# Patient Record
Sex: Female | Born: 1935
Health system: Southern US, Community
[De-identification: ages and names within clinical notes are randomized; demographics above are authoritative.]

## PROBLEM LIST (undated history)

## (undated) DIAGNOSIS — E119 Type 2 diabetes mellitus without complications: Secondary | ICD-10-CM

## (undated) DIAGNOSIS — I10 Essential (primary) hypertension: Secondary | ICD-10-CM

## (undated) DIAGNOSIS — E785 Hyperlipidemia, unspecified: Secondary | ICD-10-CM

## (undated) DIAGNOSIS — I509 Heart failure, unspecified: Secondary | ICD-10-CM

## (undated) HISTORY — DX: Essential (primary) hypertension: I10

## (undated) HISTORY — DX: Heart failure, unspecified: I50.9

## (undated) HISTORY — DX: Hyperlipidemia, unspecified: E78.5

## (undated) HISTORY — DX: Type 2 diabetes mellitus without complications: E11.9

---

## 2001-03-18 ENCOUNTER — Encounter: Payer: Self-pay | Admitting: *Deleted

## 2001-03-18 ENCOUNTER — Encounter: Admission: RE | Admit: 2001-03-18 | Discharge: 2001-03-18 | Payer: Self-pay | Admitting: *Deleted

## 2001-05-14 ENCOUNTER — Encounter (INDEPENDENT_AMBULATORY_CARE_PROVIDER_SITE_OTHER): Payer: Self-pay | Admitting: Specialist

## 2001-05-14 ENCOUNTER — Ambulatory Visit (HOSPITAL_COMMUNITY): Admission: RE | Admit: 2001-05-14 | Discharge: 2001-05-14 | Payer: Self-pay | Admitting: Gastroenterology

## 2002-04-20 ENCOUNTER — Encounter: Admission: RE | Admit: 2002-04-20 | Discharge: 2002-04-20 | Payer: Self-pay | Admitting: Family Medicine

## 2002-04-20 ENCOUNTER — Encounter: Payer: Self-pay | Admitting: Family Medicine

## 2003-05-03 ENCOUNTER — Encounter: Payer: Self-pay | Admitting: Family Medicine

## 2003-05-03 ENCOUNTER — Encounter: Admission: RE | Admit: 2003-05-03 | Discharge: 2003-05-03 | Payer: Self-pay | Admitting: Family Medicine

## 2004-07-13 ENCOUNTER — Encounter: Admission: RE | Admit: 2004-07-13 | Discharge: 2004-07-13 | Payer: Self-pay | Admitting: Family Medicine

## 2005-08-17 ENCOUNTER — Encounter: Admission: RE | Admit: 2005-08-17 | Discharge: 2005-08-17 | Payer: Self-pay | Admitting: Family Medicine

## 2007-02-08 ENCOUNTER — Emergency Department (HOSPITAL_COMMUNITY): Admission: EM | Admit: 2007-02-08 | Discharge: 2007-02-09 | Payer: Self-pay | Admitting: Emergency Medicine

## 2010-03-24 ENCOUNTER — Ambulatory Visit: Payer: Self-pay | Admitting: Radiology

## 2010-03-24 ENCOUNTER — Ambulatory Visit (HOSPITAL_BASED_OUTPATIENT_CLINIC_OR_DEPARTMENT_OTHER): Admission: RE | Admit: 2010-03-24 | Discharge: 2010-03-24 | Payer: Self-pay | Admitting: Family Medicine

## 2011-02-16 NOTE — Procedures (Signed)
[facility]  Patient:    Amanda Bartlett, Amanda Bartlett                      MRN: [id number] Proc. Date: 05/14/01 Adm. Date:  [id number] Attending:  Rich Brave CC:         Marinda Elk, M.D./Carol Clarita Leber, P.A.-C., [facility]. Med.   Procedure Report  PROCEDURE:  Colonoscopy with biopsies.  INDICATION:  Heme-positive stool on one out of three specimens in a 76 year old asymptomatic patient.  FINDINGS:  Two diminutive polyps removed by cold biopsy technique.  DESCRIPTION OF PROCEDURE:  The nature, purpose, and risks of the procedure have been discussed with the patient who provided written consent.  Sedation for this procedure and the upper endoscopy which preceded it totaled fentanyl 100 mcg and Versed 10 mg IV without arrhythmias or desaturation.  The Olympus adult video colonoscope was advanced without too much difficulty to the base of the cecum, as identified by clear visualization of the appendiceal orifice, and pullback was then performed.  We had to turn the patient into the supine position to get into the base of the cecum.  There were two small sessile polyps, one at about 30 cm, one right at the level of the ileocecal valve on the contralateral wall of the colon, each removed by one or two cold biopsies.  No large polyps, cancer, colitis, vascular malformations, or diverticulosis were appreciated.  Retroflexion was attempted in the rectum but could not be accomplished due to a small ampulla. Antegrade viewing disclosed no distal rectal lesions.  The patient tolerated the procedure well, and there were no apparent complications.  IMPRESSION:  Normal exam except for two very small sessile polyps.  PLAN:  Await pathology on the polyps.  No source of heme-positive stool was identified on this exam, so it is felt more likely to be due to the gastritis observed during her endoscopy. DD:  05/14/01 TD:  05/14/01 Job: [id number]

## 2011-02-16 NOTE — Procedures (Signed)
[facility]  Patient:    Amanda Bartlett, Amanda Bartlett                      MRN: [id number] Proc. Date: 05/14/01 Adm. Date:  [id number] Attending:  Rich Brave CC:         Marinda Elk, M.D. / Katrina Stack, P.A.-C., [facility]. Med.   Procedure Report  PROCEDURE:  Upper endoscopy.  INDICATION:  Heme-positive stool on one out of three Hemoccults in a 76 year old female without symptoms.  FINDINGS:  Bile reflux with mucosal hemorrhagic gastritis.  DESCRIPTION OF PROCEDURE:  The nature, purpose and risks of the procedure have been reviewed with the patient who provided written consent.  Sedation was fentanyl 75 mcg and Versed 6 mg IV without desaturations or arrhythmias.  The Olympus video endoscope was passed under direct vision.  The vocal cords were briefly and looked normal.  The esophagus was entered with significant difficulty and had normal mucosa throughout its entire length,  Specifically, without evidence of reflux esophagitis, Barretts esophagus, varices, infection, or neoplasia.  No ring, stricture, or hiatal hernia was appreciated.  The stomach was entered.  It contained a moderate bilious residual, probably about 60 cc, with a little bit of retained debris that could either be food or medication.  In addition, there were multiple mucosal hemorrhages in the mid body of the stomach, although I did not really see diffuse beefy-red mucosa to suggest bile reflux gastritis and no erosions, ulcers, polyps, or masses were observed, including retroflex view of the proximal stomach.  The pylorus, duodenal bulb, and second duodenum looked normal.  The scope was removed from the patient.  No biopsies were obtained.  She tolerated the procedure well, and there were no apparent complications.  IMPRESSION:  Bile reflux with mucosal hemorrhages which might account for the patients heme-positive stool.  The bile reflux is not necessarily a pathologic  finding.  PLAN: 1. No specific treatment is needed for the observed endoscopic findings in    view of the absence of symptoms. 2. Proceed to colonoscopic evaluation in view of the heme-positive stool. DD:  05/14/01 TD:  05/14/01 Job: [id number]

## 2011-04-10 ENCOUNTER — Other Ambulatory Visit: Payer: Self-pay | Admitting: Family Medicine

## 2011-04-10 DIAGNOSIS — Z1231 Encounter for screening mammogram for malignant neoplasm of breast: Secondary | ICD-10-CM

## 2011-05-03 ENCOUNTER — Ambulatory Visit
Admission: RE | Admit: 2011-05-03 | Discharge: 2011-05-03 | Disposition: A | Discharge status: Home / Self Care | Payer: Medicare Other | Source: Ambulatory Visit | Attending: Family Medicine | Admitting: Family Medicine

## 2011-05-03 DIAGNOSIS — Z1231 Encounter for screening mammogram for malignant neoplasm of breast: Secondary | ICD-10-CM

## 2012-01-09 DIAGNOSIS — I10 Essential (primary) hypertension: Secondary | ICD-10-CM | POA: Diagnosis not present

## 2012-01-09 DIAGNOSIS — E7800 Pure hypercholesterolemia, unspecified: Secondary | ICD-10-CM | POA: Diagnosis not present

## 2012-01-09 DIAGNOSIS — IMO0001 Reserved for inherently not codable concepts without codable children: Secondary | ICD-10-CM | POA: Diagnosis not present

## 2012-04-25 DIAGNOSIS — IMO0001 Reserved for inherently not codable concepts without codable children: Secondary | ICD-10-CM | POA: Diagnosis not present

## 2012-04-25 DIAGNOSIS — E7800 Pure hypercholesterolemia, unspecified: Secondary | ICD-10-CM | POA: Diagnosis not present

## 2012-04-25 DIAGNOSIS — I10 Essential (primary) hypertension: Secondary | ICD-10-CM | POA: Diagnosis not present

## 2012-10-31 DIAGNOSIS — L989 Disorder of the skin and subcutaneous tissue, unspecified: Secondary | ICD-10-CM | POA: Diagnosis not present

## 2012-10-31 DIAGNOSIS — E119 Type 2 diabetes mellitus without complications: Secondary | ICD-10-CM | POA: Diagnosis not present

## 2012-10-31 DIAGNOSIS — E7800 Pure hypercholesterolemia, unspecified: Secondary | ICD-10-CM | POA: Diagnosis not present

## 2012-10-31 DIAGNOSIS — I10 Essential (primary) hypertension: Secondary | ICD-10-CM | POA: Diagnosis not present

## 2012-12-02 DIAGNOSIS — L570 Actinic keratosis: Secondary | ICD-10-CM | POA: Diagnosis not present

## 2012-12-02 DIAGNOSIS — D239 Other benign neoplasm of skin, unspecified: Secondary | ICD-10-CM | POA: Diagnosis not present

## 2012-12-02 DIAGNOSIS — L565 Disseminated superficial actinic porokeratosis (DSAP): Secondary | ICD-10-CM | POA: Diagnosis not present

## 2013-02-11 DIAGNOSIS — L565 Disseminated superficial actinic porokeratosis (DSAP): Secondary | ICD-10-CM | POA: Diagnosis not present

## 2013-02-11 DIAGNOSIS — L570 Actinic keratosis: Secondary | ICD-10-CM | POA: Diagnosis not present

## 2013-04-27 ENCOUNTER — Other Ambulatory Visit: Payer: Self-pay | Admitting: Family Medicine

## 2013-04-27 ENCOUNTER — Other Ambulatory Visit: Payer: Self-pay

## 2013-04-27 DIAGNOSIS — Z1231 Encounter for screening mammogram for malignant neoplasm of breast: Secondary | ICD-10-CM

## 2013-04-27 DIAGNOSIS — Z1239 Encounter for other screening for malignant neoplasm of breast: Secondary | ICD-10-CM | POA: Diagnosis not present

## 2013-04-27 DIAGNOSIS — N182 Chronic kidney disease, stage 2 (mild): Secondary | ICD-10-CM | POA: Diagnosis not present

## 2013-04-27 DIAGNOSIS — Z1211 Encounter for screening for malignant neoplasm of colon: Secondary | ICD-10-CM | POA: Diagnosis not present

## 2013-04-27 DIAGNOSIS — I10 Essential (primary) hypertension: Secondary | ICD-10-CM | POA: Diagnosis not present

## 2013-04-27 DIAGNOSIS — E119 Type 2 diabetes mellitus without complications: Secondary | ICD-10-CM | POA: Diagnosis not present

## 2013-04-27 DIAGNOSIS — E7800 Pure hypercholesterolemia, unspecified: Secondary | ICD-10-CM | POA: Diagnosis not present

## 2013-05-11 DIAGNOSIS — Z1211 Encounter for screening for malignant neoplasm of colon: Secondary | ICD-10-CM | POA: Diagnosis not present

## 2013-05-12 ENCOUNTER — Ambulatory Visit
Admission: RE | Admit: 2013-05-12 | Discharge: 2013-05-12 | Disposition: A | Discharge status: Home / Self Care | Payer: Medicare Other | Source: Ambulatory Visit | Attending: Family Medicine | Admitting: Family Medicine

## 2013-05-12 DIAGNOSIS — Z1231 Encounter for screening mammogram for malignant neoplasm of breast: Secondary | ICD-10-CM

## 2013-05-14 ENCOUNTER — Other Ambulatory Visit: Payer: Self-pay | Admitting: Family Medicine

## 2013-05-14 DIAGNOSIS — R928 Other abnormal and inconclusive findings on diagnostic imaging of breast: Secondary | ICD-10-CM

## 2013-05-29 ENCOUNTER — Ambulatory Visit
Admission: RE | Admit: 2013-05-29 | Discharge: 2013-05-29 | Disposition: A | Discharge status: Home / Self Care | Payer: Medicare Other | Source: Ambulatory Visit | Attending: Family Medicine | Admitting: Family Medicine

## 2013-05-29 DIAGNOSIS — N6489 Other specified disorders of breast: Secondary | ICD-10-CM | POA: Diagnosis not present

## 2013-05-29 DIAGNOSIS — R928 Other abnormal and inconclusive findings on diagnostic imaging of breast: Secondary | ICD-10-CM

## 2013-06-10 DIAGNOSIS — L570 Actinic keratosis: Secondary | ICD-10-CM | POA: Diagnosis not present

## 2013-06-10 DIAGNOSIS — L565 Disseminated superficial actinic porokeratosis (DSAP): Secondary | ICD-10-CM | POA: Diagnosis not present

## 2013-07-09 DIAGNOSIS — H2510 Age-related nuclear cataract, unspecified eye: Secondary | ICD-10-CM | POA: Diagnosis not present

## 2013-07-09 DIAGNOSIS — E119 Type 2 diabetes mellitus without complications: Secondary | ICD-10-CM | POA: Diagnosis not present

## 2013-07-30 DIAGNOSIS — Z23 Encounter for immunization: Secondary | ICD-10-CM | POA: Diagnosis not present

## 2013-09-20 DIAGNOSIS — J111 Influenza due to unidentified influenza virus with other respiratory manifestations: Secondary | ICD-10-CM | POA: Diagnosis not present

## 2013-10-29 DIAGNOSIS — E1129 Type 2 diabetes mellitus with other diabetic kidney complication: Secondary | ICD-10-CM | POA: Diagnosis not present

## 2013-10-29 DIAGNOSIS — E7800 Pure hypercholesterolemia, unspecified: Secondary | ICD-10-CM | POA: Diagnosis not present

## 2013-10-29 DIAGNOSIS — N182 Chronic kidney disease, stage 2 (mild): Secondary | ICD-10-CM | POA: Diagnosis not present

## 2013-10-29 DIAGNOSIS — I10 Essential (primary) hypertension: Secondary | ICD-10-CM | POA: Diagnosis not present

## 2013-11-05 ENCOUNTER — Other Ambulatory Visit: Payer: Self-pay | Admitting: Family Medicine

## 2013-11-05 DIAGNOSIS — N6310 Unspecified lump in the right breast, unspecified quadrant: Secondary | ICD-10-CM

## 2013-11-30 ENCOUNTER — Ambulatory Visit
Admission: RE | Admit: 2013-11-30 | Discharge: 2013-11-30 | Disposition: A | Discharge status: Home / Self Care | Payer: Medicare Other | Source: Ambulatory Visit | Attending: Family Medicine | Admitting: Family Medicine

## 2013-11-30 DIAGNOSIS — R928 Other abnormal and inconclusive findings on diagnostic imaging of breast: Secondary | ICD-10-CM | POA: Diagnosis not present

## 2013-11-30 DIAGNOSIS — N6310 Unspecified lump in the right breast, unspecified quadrant: Secondary | ICD-10-CM

## 2013-12-15 DIAGNOSIS — D1801 Hemangioma of skin and subcutaneous tissue: Secondary | ICD-10-CM | POA: Diagnosis not present

## 2013-12-15 DIAGNOSIS — L570 Actinic keratosis: Secondary | ICD-10-CM | POA: Diagnosis not present

## 2013-12-15 DIAGNOSIS — D239 Other benign neoplasm of skin, unspecified: Secondary | ICD-10-CM | POA: Diagnosis not present

## 2013-12-15 DIAGNOSIS — L821 Other seborrheic keratosis: Secondary | ICD-10-CM | POA: Diagnosis not present

## 2013-12-15 DIAGNOSIS — L819 Disorder of pigmentation, unspecified: Secondary | ICD-10-CM | POA: Diagnosis not present

## 2013-12-15 DIAGNOSIS — L565 Disseminated superficial actinic porokeratosis (DSAP): Secondary | ICD-10-CM | POA: Diagnosis not present

## 2013-12-15 DIAGNOSIS — D485 Neoplasm of uncertain behavior of skin: Secondary | ICD-10-CM | POA: Diagnosis not present

## 2013-12-16 DIAGNOSIS — C44611 Basal cell carcinoma of skin of unspecified upper limb, including shoulder: Secondary | ICD-10-CM | POA: Diagnosis not present

## 2013-12-16 DIAGNOSIS — D2330 Other benign neoplasm of skin of unspecified part of face: Secondary | ICD-10-CM | POA: Diagnosis not present

## 2014-01-08 DIAGNOSIS — C44611 Basal cell carcinoma of skin of unspecified upper limb, including shoulder: Secondary | ICD-10-CM | POA: Diagnosis not present

## 2014-01-11 DIAGNOSIS — C44611 Basal cell carcinoma of skin of unspecified upper limb, including shoulder: Secondary | ICD-10-CM | POA: Diagnosis not present

## 2014-04-27 DIAGNOSIS — E7800 Pure hypercholesterolemia, unspecified: Secondary | ICD-10-CM | POA: Diagnosis not present

## 2014-04-27 DIAGNOSIS — E1129 Type 2 diabetes mellitus with other diabetic kidney complication: Secondary | ICD-10-CM | POA: Diagnosis not present

## 2014-04-27 DIAGNOSIS — I10 Essential (primary) hypertension: Secondary | ICD-10-CM | POA: Diagnosis not present

## 2014-04-27 DIAGNOSIS — N182 Chronic kidney disease, stage 2 (mild): Secondary | ICD-10-CM | POA: Diagnosis not present

## 2014-05-18 ENCOUNTER — Other Ambulatory Visit: Payer: Self-pay

## 2014-05-18 ENCOUNTER — Other Ambulatory Visit: Payer: Self-pay | Admitting: Family Medicine

## 2014-05-18 DIAGNOSIS — N6489 Other specified disorders of breast: Secondary | ICD-10-CM

## 2014-05-25 DIAGNOSIS — E119 Type 2 diabetes mellitus without complications: Secondary | ICD-10-CM | POA: Diagnosis not present

## 2014-05-26 ENCOUNTER — Ambulatory Visit
Admission: RE | Admit: 2014-05-26 | Discharge: 2014-05-26 | Disposition: A | Discharge status: Home / Self Care | Payer: Medicare Other | Source: Ambulatory Visit | Attending: Family Medicine | Admitting: Family Medicine

## 2014-05-26 DIAGNOSIS — N6489 Other specified disorders of breast: Secondary | ICD-10-CM

## 2014-07-09 DIAGNOSIS — Z23 Encounter for immunization: Secondary | ICD-10-CM | POA: Diagnosis not present

## 2014-08-24 DIAGNOSIS — H2513 Age-related nuclear cataract, bilateral: Secondary | ICD-10-CM | POA: Diagnosis not present

## 2014-08-24 DIAGNOSIS — E119 Type 2 diabetes mellitus without complications: Secondary | ICD-10-CM | POA: Diagnosis not present

## 2014-10-13 DIAGNOSIS — Z23 Encounter for immunization: Secondary | ICD-10-CM | POA: Diagnosis not present

## 2014-11-01 DIAGNOSIS — E780 Pure hypercholesterolemia: Secondary | ICD-10-CM | POA: Diagnosis not present

## 2014-11-01 DIAGNOSIS — E119 Type 2 diabetes mellitus without complications: Secondary | ICD-10-CM | POA: Diagnosis not present

## 2014-11-01 DIAGNOSIS — Z1389 Encounter for screening for other disorder: Secondary | ICD-10-CM | POA: Diagnosis not present

## 2014-11-01 DIAGNOSIS — I10 Essential (primary) hypertension: Secondary | ICD-10-CM | POA: Diagnosis not present

## 2014-12-21 DIAGNOSIS — L814 Other melanin hyperpigmentation: Secondary | ICD-10-CM | POA: Diagnosis not present

## 2014-12-21 DIAGNOSIS — D1800 Hemangioma unspecified site: Secondary | ICD-10-CM | POA: Diagnosis not present

## 2014-12-21 DIAGNOSIS — L821 Other seborrheic keratosis: Secondary | ICD-10-CM | POA: Diagnosis not present

## 2014-12-21 DIAGNOSIS — Z85828 Personal history of other malignant neoplasm of skin: Secondary | ICD-10-CM | POA: Diagnosis not present

## 2014-12-21 DIAGNOSIS — L7211 Pilar cyst: Secondary | ICD-10-CM | POA: Diagnosis not present

## 2014-12-21 DIAGNOSIS — L565 Disseminated superficial actinic porokeratosis (DSAP): Secondary | ICD-10-CM | POA: Diagnosis not present

## 2014-12-21 DIAGNOSIS — L570 Actinic keratosis: Secondary | ICD-10-CM | POA: Diagnosis not present

## 2015-05-02 DIAGNOSIS — E780 Pure hypercholesterolemia: Secondary | ICD-10-CM | POA: Diagnosis not present

## 2015-05-02 DIAGNOSIS — E119 Type 2 diabetes mellitus without complications: Secondary | ICD-10-CM | POA: Diagnosis not present

## 2015-05-02 DIAGNOSIS — I10 Essential (primary) hypertension: Secondary | ICD-10-CM | POA: Diagnosis not present

## 2015-05-02 DIAGNOSIS — F4011 Social phobia, generalized: Secondary | ICD-10-CM | POA: Diagnosis not present

## 2015-05-05 DIAGNOSIS — Z1211 Encounter for screening for malignant neoplasm of colon: Secondary | ICD-10-CM | POA: Diagnosis not present

## 2015-05-12 ENCOUNTER — Other Ambulatory Visit: Payer: Self-pay

## 2015-05-12 DIAGNOSIS — Z1231 Encounter for screening mammogram for malignant neoplasm of breast: Secondary | ICD-10-CM

## 2015-06-01 ENCOUNTER — Ambulatory Visit
Admission: RE | Admit: 2015-06-01 | Discharge: 2015-06-01 | Disposition: A | Discharge status: Home / Self Care | Payer: Medicare Other | Source: Ambulatory Visit

## 2015-06-01 DIAGNOSIS — Z1231 Encounter for screening mammogram for malignant neoplasm of breast: Secondary | ICD-10-CM

## 2015-06-30 DIAGNOSIS — Z23 Encounter for immunization: Secondary | ICD-10-CM | POA: Diagnosis not present

## 2015-11-09 DIAGNOSIS — E119 Type 2 diabetes mellitus without complications: Secondary | ICD-10-CM | POA: Diagnosis not present

## 2015-11-09 DIAGNOSIS — Z7984 Long term (current) use of oral hypoglycemic drugs: Secondary | ICD-10-CM | POA: Diagnosis not present

## 2015-11-09 DIAGNOSIS — M545 Low back pain: Secondary | ICD-10-CM | POA: Diagnosis not present

## 2015-11-09 DIAGNOSIS — E1165 Type 2 diabetes mellitus with hyperglycemia: Secondary | ICD-10-CM | POA: Diagnosis not present

## 2015-11-09 DIAGNOSIS — I10 Essential (primary) hypertension: Secondary | ICD-10-CM | POA: Diagnosis not present

## 2015-11-14 DIAGNOSIS — M545 Low back pain: Secondary | ICD-10-CM | POA: Diagnosis not present

## 2015-11-17 DIAGNOSIS — M545 Low back pain: Secondary | ICD-10-CM | POA: Diagnosis not present

## 2015-11-21 DIAGNOSIS — M545 Low back pain: Secondary | ICD-10-CM | POA: Diagnosis not present

## 2015-11-24 DIAGNOSIS — M545 Low back pain: Secondary | ICD-10-CM | POA: Diagnosis not present

## 2015-11-28 DIAGNOSIS — M545 Low back pain: Secondary | ICD-10-CM | POA: Diagnosis not present

## 2015-12-09 DIAGNOSIS — M545 Low back pain: Secondary | ICD-10-CM | POA: Diagnosis not present

## 2015-12-20 DIAGNOSIS — M47816 Spondylosis without myelopathy or radiculopathy, lumbar region: Secondary | ICD-10-CM | POA: Diagnosis not present

## 2015-12-30 DIAGNOSIS — M47816 Spondylosis without myelopathy or radiculopathy, lumbar region: Secondary | ICD-10-CM | POA: Diagnosis not present

## 2015-12-30 DIAGNOSIS — M545 Low back pain: Secondary | ICD-10-CM | POA: Diagnosis not present

## 2016-01-21 DIAGNOSIS — M545 Low back pain: Secondary | ICD-10-CM | POA: Diagnosis not present

## 2016-01-21 DIAGNOSIS — G8929 Other chronic pain: Secondary | ICD-10-CM | POA: Diagnosis not present

## 2016-01-21 DIAGNOSIS — M47816 Spondylosis without myelopathy or radiculopathy, lumbar region: Secondary | ICD-10-CM | POA: Diagnosis not present

## 2016-02-08 DIAGNOSIS — M47816 Spondylosis without myelopathy or radiculopathy, lumbar region: Secondary | ICD-10-CM | POA: Diagnosis not present

## 2016-04-23 DIAGNOSIS — Z0100 Encounter for examination of eyes and vision without abnormal findings: Secondary | ICD-10-CM | POA: Diagnosis not present

## 2016-04-23 DIAGNOSIS — E119 Type 2 diabetes mellitus without complications: Secondary | ICD-10-CM | POA: Diagnosis not present

## 2016-05-08 DIAGNOSIS — H25812 Combined forms of age-related cataract, left eye: Secondary | ICD-10-CM | POA: Diagnosis not present

## 2016-05-08 DIAGNOSIS — H2512 Age-related nuclear cataract, left eye: Secondary | ICD-10-CM | POA: Diagnosis not present

## 2016-05-14 DIAGNOSIS — I10 Essential (primary) hypertension: Secondary | ICD-10-CM | POA: Diagnosis not present

## 2016-05-14 DIAGNOSIS — R0609 Other forms of dyspnea: Secondary | ICD-10-CM | POA: Diagnosis not present

## 2016-05-14 DIAGNOSIS — R011 Cardiac murmur, unspecified: Secondary | ICD-10-CM | POA: Diagnosis not present

## 2016-05-14 DIAGNOSIS — Z7984 Long term (current) use of oral hypoglycemic drugs: Secondary | ICD-10-CM | POA: Diagnosis not present

## 2016-05-14 DIAGNOSIS — E7800 Pure hypercholesterolemia, unspecified: Secondary | ICD-10-CM | POA: Diagnosis not present

## 2016-05-14 DIAGNOSIS — E119 Type 2 diabetes mellitus without complications: Secondary | ICD-10-CM | POA: Diagnosis not present

## 2016-05-15 ENCOUNTER — Telehealth: Payer: Self-pay | Admitting: Cardiology

## 2016-05-15 NOTE — Telephone Encounter (Signed)
Received records from [facility] for appointment on 07/02/16 with Dr Percival Spanish.  Records given to [facility] (Medical Records) for Dr Hochrein's schedule on 07/02/16. lp

## 2016-06-05 DIAGNOSIS — H25811 Combined forms of age-related cataract, right eye: Secondary | ICD-10-CM | POA: Diagnosis not present

## 2016-06-05 DIAGNOSIS — H2511 Age-related nuclear cataract, right eye: Secondary | ICD-10-CM | POA: Diagnosis not present

## 2016-06-18 DIAGNOSIS — I10 Essential (primary) hypertension: Secondary | ICD-10-CM | POA: Diagnosis not present

## 2016-06-18 DIAGNOSIS — Z23 Encounter for immunization: Secondary | ICD-10-CM | POA: Diagnosis not present

## 2016-06-20 ENCOUNTER — Encounter: Payer: Self-pay | Admitting: Cardiovascular Disease

## 2016-06-20 ENCOUNTER — Ambulatory Visit (INDEPENDENT_AMBULATORY_CARE_PROVIDER_SITE_OTHER): Payer: Medicare Other | Admitting: Cardiovascular Disease

## 2016-06-20 VITALS — BP 142/80 | HR 83 | Ht 65.0 in | Wt 162.0 lb

## 2016-06-20 DIAGNOSIS — R0600 Dyspnea, unspecified: Secondary | ICD-10-CM

## 2016-06-20 DIAGNOSIS — E119 Type 2 diabetes mellitus without complications: Secondary | ICD-10-CM | POA: Insufficient documentation

## 2016-06-20 DIAGNOSIS — E785 Hyperlipidemia, unspecified: Secondary | ICD-10-CM | POA: Insufficient documentation

## 2016-06-20 DIAGNOSIS — I10 Essential (primary) hypertension: Secondary | ICD-10-CM

## 2016-06-20 DIAGNOSIS — I351 Nonrheumatic aortic (valve) insufficiency: Secondary | ICD-10-CM | POA: Diagnosis not present

## 2016-06-20 DIAGNOSIS — R0609 Other forms of dyspnea: Secondary | ICD-10-CM

## 2016-06-20 NOTE — Assessment & Plan Note (Signed)
History of hypertension with blood pressure 142/80. He is hydrochlorothiazide, amlodipine and olmesartan. Continue current meds at current dosing

## 2016-06-20 NOTE — Assessment & Plan Note (Signed)
Mrs. Amanda Bartlett  was referred by Dr. Beryle Quant for evaluation of an auscultated murmur and new onset dyspnea on exertion. She has a history of treated hypertension, diabetes and hyperlipidemia. There is a family history of heart disease. She has never had a heart attack or stroke. Over the last year she's noted increased dyspnea on exertion with occasional atypical chest pain. Her primary care provider did auscultate a murmur. She does have a soft outflow check murmur although this does not sound like typical aortic stenosis. I'm going to get a 2-D echo to rule out critical AS and if this does not show significant aortic valve disease I will get a pharmacologic Myoview stress test.

## 2016-06-20 NOTE — Patient Instructions (Signed)
Medication Instructions:  NO CHANGES.   Testing/Procedures: Your physician has requested that you have an echocardiogram. Echocardiography is a painless test that uses sound waves to create images of your heart. It provides your doctor with information about the size and shape of your heart and how well your heart's chambers and valves are working. This procedure takes approximately one hour. There are no restrictions for this procedure.   Your physician has requested that you have a lexiscan myoview. For further information please visit [email or web address]. Please follow instruction sheet, as given.  ONLY IF NEEDED AFTER DR BERRY REVIEWS THE RESULTS OF YOUR ECHO. WE WILL SCHEDULE AT A LATER TIME IF NEEDED.   Follow-Up: Your physician recommends that you schedule a follow-up appointment in: 4-5 [place].   Any Other Special Instructions Will Be Listed Below (If Applicable). Echocardiogram An echocardiogram, or echocardiography, uses sound waves (ultrasound) to produce an image of your heart. The echocardiogram is simple, painless, obtained within a short period of time, and offers valuable information to your health care provider. The images from an echocardiogram can provide information such as:  Evidence of coronary artery disease (CAD).  Heart size.  Heart muscle function.  Heart valve function.  Aneurysm detection.  Evidence of a past heart attack.  Fluid buildup around the heart.  Heart muscle thickening.  Assess heart valve function. LET [facility] CARE PROVIDER KNOW ABOUT:  Any allergies you have.  All medicines you are taking, including vitamins, herbs, eye drops, creams, and over-the-counter medicines.  Previous problems you or members of your family have had with the use of anesthetics.  Any blood disorders you have.  Previous surgeries you have had.  Medical conditions you have.  Possibility of pregnancy, if this applies. BEFORE  THE PROCEDURE  No special preparation is needed. Eat and drink normally.  PROCEDURE   In order to produce an image of your heart, gel will be applied to your chest and a wand-like tool (transducer) will be moved over your chest. The gel will help transmit the sound waves from the transducer. The sound waves will harmlessly bounce off your heart to allow the heart images to be captured in real-time motion. These images will then be recorded.  You may need an IV to receive a medicine that improves the quality of the pictures. AFTER THE PROCEDURE You may return to your normal schedule including diet, activities, and medicines, unless your health care provider tells you otherwise.   This information is not intended to replace advice given to you by your health care provider. Make sure you discuss any questions you have with your health care provider.   Document Released: 09/14/2000 Document Revised: 10/08/2014 Document Reviewed: 05/25/2013 Elsevier Interactive Patient Education [employer].    If you need a refill on your cardiac medications before your next appointment, please call your pharmacy.

## 2016-06-20 NOTE — Progress Notes (Signed)
06/20/2016 Amanda Bartlett   [birth year]  [id number]  Primary Physician Corine Shelter, PA-C Primary Cardiologist: Lorretta Harp MD Renae Gloss  HPI:  Amanda Bartlett is a 81 year old mildly overweight married Caucasian female mother of 64, grandmother of 65 grandchildren who worked at [employer] and [employer] during her working years. She was referred by Dr. Beryle Quant for cardiovascular evaluation because of fairly new onset dyspnea on exertion and auscultated outflow tract murmur. Her risk factors include treated hypertension, diabetes and hyperlipidemia. There is a family history of heart disease. She has never had a heart attack or stroke. She's noticed increasing dyspnea on exertion over the last year with with occasional atypical chest pain. Her primary care provider did auscultate a murmur.   Current Outpatient Prescriptions  Medication Sig Dispense Refill  . aspirin EC 81 MG tablet Take 81 mg by mouth daily.    . Coenzyme Q10 (CO Q-10) 100 MG CAPS Take 100 mg by mouth daily.    Marland Kitchen glipiZIDE-metformin (METAGLIP) 5-500 MG tablet Take 1 tablet by mouth 2 (two) times daily.  1  . naproxen (NAPROSYN) 500 MG tablet Take 1 tablet by mouth daily as needed.  1  . Olmesartan-Amlodipine-HCTZ 40-5-12.5 MG TABS Take 1 tablet by mouth daily.  1  . prednisoLONE acetate (PRED FORTE) 1 % ophthalmic suspension Place 1 drop into the right eye 2 (two) times daily.  0  . simvastatin (ZOCOR) 20 MG tablet Take 20 mg by mouth daily.  3   No current facility-administered medications for this visit.     Allergies  Allergen Reactions  . Codeine Nausea Only  . Prednisone Swelling    Social History   Social History  . Marital status: Married    Spouse name: N/A  . Number of children: N/A  . Years of education: N/A   Occupational History  . Not on file.   Social History Main Topics  . Smoking status: Never Smoker  . Smokeless tobacco: Not on file  . Alcohol use Not on file  . Drug use: Unknown    . Sexual activity: Not on file   Other Topics Concern  . Not on file   Social History Narrative  . No narrative on file     Review of Systems: General: negative for chills, fever, night sweats or weight changes.  Cardiovascular: negative for chest pain, dyspnea on exertion, edema, orthopnea, palpitations, paroxysmal nocturnal dyspnea or shortness of breath Dermatological: negative for rash Respiratory: negative for cough or wheezing Urologic: negative for hematuria Abdominal: negative for nausea, vomiting, diarrhea, bright red blood per rectum, melena, or hematemesis Neurologic: negative for visual changes, syncope, or dizziness All other systems reviewed and are otherwise negative except as noted above.    Blood pressure (!) 142/80, pulse 83, height 5\' 5"  (1.651 m), weight 162 lb (73.5 kg), SpO2 96 %.  General appearance: alert and no distress Neck: no adenopathy, no carotid bruit, no JVD, supple, symmetrical, trachea midline and thyroid not enlarged, symmetric, no tenderness/mass/nodules Lungs: clear to auscultation bilaterally Heart: Soft outflow tract murmur Extremities: extremities normal, atraumatic, no cyanosis or edema and Palpable pedal pulses bilaterally  EKG normal sinus rhythm at 83 with left anterior fascicular block and reversible airway progression with occasional PVCs. I personally reviewed this EKG  ASSESSMENT AND PLAN:   Essential hypertension History of hypertension with blood pressure 142/80. He is hydrochlorothiazide, amlodipine and olmesartan. Continue current meds at current dosing  Hyperlipidemia History of hyperlipidemia on statin therapy  followed by her PCP  Dyspnea on exertion Amanda Bartlett  was referred by Dr. Beryle Quant for evaluation of an auscultated murmur and new onset dyspnea on exertion. She has a history of treated hypertension, diabetes and hyperlipidemia. There is a family history of heart disease. She has never had a heart attack or stroke.  Over the last year she's noted increased dyspnea on exertion with occasional atypical chest pain. Her primary care provider did auscultate a murmur. She does have a soft outflow check murmur although this does not sound like typical aortic stenosis. I'm going to get a 2-D echo to rule out critical AS and if this does not show significant aortic valve disease I will get a pharmacologic Myoview stress test.      Lorretta Harp MD [facility], [facility] 06/20/2016 10:23 AM

## 2016-06-20 NOTE — Assessment & Plan Note (Signed)
History of hyperlipidemia on statin therapy followed by her PCP. 

## 2016-07-02 ENCOUNTER — Ambulatory Visit: Payer: Medicare Other | Admitting: Cardiology

## 2016-07-06 ENCOUNTER — Encounter: Payer: Self-pay | Admitting: Cardiovascular Disease

## 2016-07-06 ENCOUNTER — Telehealth: Payer: Self-pay | Admitting: Cardiovascular Disease

## 2016-07-06 NOTE — Telephone Encounter (Signed)
Closed encounter °

## 2016-07-09 ENCOUNTER — Other Ambulatory Visit: Payer: Self-pay

## 2016-07-09 ENCOUNTER — Ambulatory Visit (HOSPITAL_COMMUNITY): Payer: Medicare Other | Attending: Cardiovascular Disease

## 2016-07-09 DIAGNOSIS — I517 Cardiomegaly: Secondary | ICD-10-CM | POA: Insufficient documentation

## 2016-07-09 DIAGNOSIS — I351 Nonrheumatic aortic (valve) insufficiency: Secondary | ICD-10-CM | POA: Insufficient documentation

## 2016-07-09 DIAGNOSIS — I509 Heart failure, unspecified: Secondary | ICD-10-CM | POA: Insufficient documentation

## 2016-07-24 ENCOUNTER — Ambulatory Visit (INDEPENDENT_AMBULATORY_CARE_PROVIDER_SITE_OTHER): Payer: Medicare Other | Admitting: Cardiovascular Disease

## 2016-07-24 ENCOUNTER — Encounter: Payer: Self-pay | Admitting: Cardiovascular Disease

## 2016-07-24 VITALS — BP 132/64 | HR 86 | Ht 65.0 in | Wt 164.0 lb

## 2016-07-24 DIAGNOSIS — R0609 Other forms of dyspnea: Secondary | ICD-10-CM

## 2016-07-24 DIAGNOSIS — Z79899 Other long term (current) drug therapy: Secondary | ICD-10-CM | POA: Diagnosis not present

## 2016-07-24 DIAGNOSIS — R0600 Dyspnea, unspecified: Secondary | ICD-10-CM

## 2016-07-24 MED ORDER — FUROSEMIDE 20 MG PO TABS: 20.0000 mg | ORAL_TABLET | Freq: Every day | ORAL | 3 refills | Status: DC

## 2016-07-24 NOTE — Patient Instructions (Signed)
Medication Instructions:  START Furosemide 20 mg daily.  Labwork: Your physician recommends that you return for lab work in: 7-10 days--BMET (Oct. 31-Nov 3)   Testing/Procedures: Your physician has requested that you have a lexiscan myoview. For further information please visit [email or web address]. Please follow instruction sheet, as given.    Follow-Up: We request that you follow-up in: 4-6 weeks with APP with an extender and in 3 months with Dr Andria Rhein will receive a reminder letter in the mail two months in advance. If you don't receive a letter, please call our office to schedule the follow-up appointment.    Any Other Special Instructions Will Be Listed Below (If Applicable).  Pharmacologic Stress Electrocardiogram A pharmacologic stress electrocardiogram is a heart (cardiac) test that uses nuclear imaging to evaluate the blood supply to your heart. This test may also be called a pharmacologic stress electrocardiography. Pharmacologic means that a medicine is used to increase your heart rate and blood pressure.  This stress test is done to find areas of poor blood flow to the heart by determining the extent of coronary artery disease (CAD). Some people exercise on a treadmill, which naturally increases the blood flow to the heart. For those people unable to exercise on a treadmill, a medicine is used. This medicine stimulates your heart and will cause your heart to beat harder and more quickly, as if you were exercising.  Pharmacologic stress tests can help determine:  The adequacy of blood flow to your heart during increased levels of activity in order to clear you for discharge home.  The extent of coronary artery blockage caused by CAD.  Your prognosis if you have suffered a heart attack.  The effectiveness of cardiac procedures done, such as an angioplasty, which can increase the circulation in your coronary arteries.  Causes of chest pain or pressure. LET [facility]  CARE PROVIDER KNOW ABOUT:  Any allergies you have.  All medicines you are taking, including vitamins, herbs, eye drops, creams, and over-the-counter medicines.  Previous problems you or members of your family have had with the use of anesthetics.  Any blood disorders you have.  Previous surgeries you have had.  Medical conditions you have.  Possibility of pregnancy, if this applies.  If you are currently breastfeeding. RISKS AND COMPLICATIONS Generally, this is a safe procedure. However, as with any procedure, complications can occur. Possible complications include:  You develop pain or pressure in the following areas:  Chest.  Jaw or neck.  Between your shoulder blades.  Radiating down your left arm.  Headache.  Dizziness or light-headedness.  Shortness of breath.  Increased or irregular heartbeat.  Low blood pressure.  Nausea or vomiting.  Flushing.  Redness going up the arm and slight pain during injection of medicine.  Heart attack (rare). BEFORE THE PROCEDURE   Avoid all forms of caffeine for 24 hours before your test or as directed by your health care provider. This includes coffee, tea (even decaffeinated tea), caffeinated sodas, chocolate, cocoa, and certain pain medicines.  Follow your health care provider's instructions regarding eating and drinking before the test.  Take your medicines as directed at regular times with water unless instructed otherwise. Exceptions may include:  If you have diabetes, ask how you are to take your insulin or pills. It is common to adjust insulin dosing the morning of the test.  If you are taking beta-blocker medicines, it is important to talk to your health care provider about these medicines well before the date  of your test. Taking beta-blocker medicines may interfere with the test. In some cases, these medicines need to be changed or stopped 24 hours or more before the test.  If you wear a nitroglycerin patch, it  may need to be removed prior to the test. Ask your health care provider if the patch should be removed before the test.  If you use an inhaler for any breathing condition, bring it with you to the test.  If you are an outpatient, bring a snack so you can eat right after the stress phase of the test.  Do not smoke for 4 hours prior to the test or as directed by your health care provider.  Do not apply lotions, powders, creams, or oils on your chest prior to the test.  Wear comfortable shoes and clothing. Let your health care provider know if you were unable to complete or follow the preparations for your test. PROCEDURE   Multiple patches (electrodes) will be put on your chest. If needed, small areas of your chest may be shaved to get better contact with the electrodes. Once the electrodes are attached to your body, multiple wires will be attached to the electrodes, and your heart rate will be monitored.  An IV access will be started. A nuclear trace (isotope) is given. The isotope may be given intravenously, or it may be swallowed. Nuclear refers to several types of radioactive isotopes, and the nuclear isotope lights up the arteries so that the nuclear images are clear. The isotope is absorbed by your body. This results in low radiation exposure.  A resting nuclear image is taken to show how your heart functions at rest.  A medicine is given through the IV access.  A second scan is done about 1 hour after the medicine injection and determines how your heart functions under stress.  During this stress phase, you will be connected to an electrocardiogram machine. Your blood pressure and oxygen levels will be monitored. AFTER THE PROCEDURE   Your heart rate and blood pressure will be monitored after the test.  You may return to your normal schedule, including diet,activities, and medicines, unless your health care provider tells you otherwise.   This information is not intended to  replace advice given to you by your health care provider. Make sure you discuss any questions you have with your health care provider.   Document Released: 02/03/2009 Document Revised: 09/22/2013 Document Reviewed: 05/25/2013 Elsevier Interactive Patient Education [employer].    If you need a refill on your cardiac medications before your next appointment, please call your pharmacy.

## 2016-07-24 NOTE — Assessment & Plan Note (Signed)
Mrs. Amanda Bartlett returns today for follow-up of her 2-D echo performed in evaluation of dyspnea on exertion. The ultrasound performed 07/09/16 revealed normal LV size and function, rate 1 diastolic dysfunction with mild to moderate aortic insufficiency. She has noted increased lower extremity edema. I'm going to add furosemide 20 mg a day on top of her hydrochlorothiazide. We'll check a basic metabolic panel in 0000000 days to assess renal function and electrolytes. I'm also going to get a pharmacologic Myoview stress test to rule out ischemic etiology. She will see mid-level provider back in 4-6 weeks and me back in 3 months.

## 2016-07-24 NOTE — Progress Notes (Signed)
07/24/2016 Amanda Bartlett   07-Oct-1934  [id number]  Primary Physician Corine Shelter, PA-C Primary Cardiologist: Lorretta Harp MD Renae Gloss  HPI:  Amanda Bartlett is a 81 year old mildly overweight married Caucasian female mother of 64, grandmother of 84 grandchildren who worked at [employer] and [employer] during her working years. She was referred by Dr. Beryle Quant for cardiovascular evaluation because of fairly new onset dyspnea on exertion and auscultated outflow tract murmur. I last saw her in the office 06/20/16 Her risk factors include treated hypertension, diabetes and hyperlipidemia. There is a family history of heart disease. She has never had a heart attack or stroke. She's noticed increasing dyspnea on exertion over the last year with with occasional atypical chest pain. Her primary care provider did auscultate a murmur. I obtained a 2-D echocardiogram on 07/09/16 revealing normal LV size and function with mild to moderate aortic insufficiency.   Current Outpatient Prescriptions  Medication Sig Dispense Refill  . aspirin EC 81 MG tablet Take 81 mg by mouth daily.    . Coenzyme Q10 (CO Q-10) 100 MG CAPS Take 100 mg by mouth daily.    Marland Kitchen glipiZIDE-metformin (METAGLIP) 5-500 MG tablet Take 1 tablet by mouth 2 (two) times daily.  1  . naproxen (NAPROSYN) 500 MG tablet Take 1 tablet by mouth daily as needed.  1  . Olmesartan-Amlodipine-HCTZ 40-5-12.5 MG TABS Take 1 tablet by mouth daily.  1  . simvastatin (ZOCOR) 20 MG tablet Take 20 mg by mouth daily.  3   No current facility-administered medications for this visit.     Allergies  Allergen Reactions  . Codeine Nausea Only  . Prednisone Swelling    Social History   Social History  . Marital status: Married    Spouse name: N/A  . Number of children: N/A  . Years of education: N/A   Occupational History  . Not on file.   Social History Main Topics  . Smoking status: Never Smoker  . Smokeless tobacco: Never Used  .  Alcohol use Not on file  . Drug use: Unknown  . Sexual activity: Not on file   Other Topics Concern  . Not on file   Social History Narrative  . No narrative on file     Review of Systems: General: negative for chills, fever, night sweats or weight changes.  Cardiovascular: negative for chest pain, dyspnea on exertion, edema, orthopnea, palpitations, paroxysmal nocturnal dyspnea or shortness of breath Dermatological: negative for rash Respiratory: negative for cough or wheezing Urologic: negative for hematuria Abdominal: negative for nausea, vomiting, diarrhea, bright red blood per rectum, melena, or hematemesis Neurologic: negative for visual changes, syncope, or dizziness All other systems reviewed and are otherwise negative except as noted above.    Blood pressure 132/64, pulse 86, height 5\' 5"  (1.651 m), weight 164 lb (74.4 kg).  General appearance: alert and no distress Neck: no adenopathy, no carotid bruit, no JVD, supple, symmetrical, trachea midline and thyroid not enlarged, symmetric, no tenderness/mass/nodules Lungs: clear to auscultation bilaterally Heart: Soft diastolic murmur at the left upper sternal border Extremities: 1-2+ pitting edema bilaterally  EKG not performed today  ASSESSMENT AND PLAN:   Dyspnea on exertion Amanda Bartlett returns today for follow-up of her 2-D echo performed in evaluation of dyspnea on exertion. The ultrasound performed 07/09/16 revealed normal LV size and function, rate 1 diastolic dysfunction with mild to moderate aortic insufficiency. She has noted increased lower extremity edema. I'm going to add furosemide 20  mg a day on top of her hydrochlorothiazide. We'll check a basic metabolic panel in 0000000 days to assess renal function and electrolytes. I'm also going to get a pharmacologic Myoview stress test to rule out ischemic etiology. She will see mid-level provider back in 4-6 weeks and me back in 3 months.      Lorretta Harp MD  FACP,FACC,FAHA, FSCAI 07/24/2016 11:00 AM

## 2016-07-27 ENCOUNTER — Telehealth (HOSPITAL_COMMUNITY): Payer: Self-pay

## 2016-07-27 NOTE — Telephone Encounter (Signed)
Encounter complete. 

## 2016-07-31 ENCOUNTER — Ambulatory Visit (HOSPITAL_COMMUNITY)
Admission: RE | Admit: 2016-07-31 | Discharge: 2016-07-31 | Disposition: A | Discharge status: Home / Self Care | Payer: Medicare Other | Source: Ambulatory Visit | Attending: Cardiovascular Disease | Admitting: Cardiovascular Disease

## 2016-07-31 DIAGNOSIS — R0600 Dyspnea, unspecified: Secondary | ICD-10-CM

## 2016-07-31 DIAGNOSIS — Z8249 Family history of ischemic heart disease and other diseases of the circulatory system: Secondary | ICD-10-CM | POA: Diagnosis not present

## 2016-07-31 DIAGNOSIS — R0609 Other forms of dyspnea: Secondary | ICD-10-CM | POA: Diagnosis not present

## 2016-07-31 DIAGNOSIS — I493 Ventricular premature depolarization: Secondary | ICD-10-CM | POA: Diagnosis not present

## 2016-07-31 LAB — MYOCARDIAL PERFUSION IMAGING
CHL CUP NUCLEAR SRS: 1
CHL CUP NUCLEAR SSS: 3
NUC STRESS TID: 1.16
Peak HR: 98 {beats}/min
Rest HR: 80 {beats}/min
SDS: 2

## 2016-07-31 MED ORDER — TECHNETIUM TC 99M TETROFOSMIN IV KIT
10.4000 | PACK | Freq: Once | INTRAVENOUS | Status: AC | PRN
  Administered 2016-07-31: 10.4 via INTRAVENOUS
  Filled 2016-07-31: qty 11

## 2016-07-31 MED ORDER — TECHNETIUM TC 99M TETROFOSMIN IV KIT
31.2000 | PACK | Freq: Once | INTRAVENOUS | Status: AC | PRN
  Administered 2016-07-31: 31.2 via INTRAVENOUS
  Filled 2016-07-31: qty 32

## 2016-07-31 MED ORDER — REGADENOSON 0.4 MG/5ML IV SOLN
0.4000 mg | Freq: Once | INTRAVENOUS | Status: AC
  Administered 2016-07-31: 0.4 mg via INTRAVENOUS

## 2016-08-02 ENCOUNTER — Other Ambulatory Visit: Payer: Self-pay | Admitting: Cardiovascular Disease

## 2016-08-02 DIAGNOSIS — Z79899 Other long term (current) drug therapy: Secondary | ICD-10-CM | POA: Diagnosis not present

## 2016-08-03 LAB — BASIC METABOLIC PANEL
BUN: 15 mg/dL (ref 7–25)
CHLORIDE: 102 mmol/L (ref 98–110)
CO2: 25 mmol/L (ref 20–31)
CREATININE: 1.07 mg/dL — AB (ref 0.60–0.93)
Calcium: 9.9 mg/dL (ref 8.6–10.4)
Glucose, Bld: 215 mg/dL — ABNORMAL HIGH (ref 65–99)
POTASSIUM: 4.2 mmol/L (ref 3.5–5.3)
Sodium: 139 mmol/L (ref 135–146)

## 2016-08-10 ENCOUNTER — Ambulatory Visit: Payer: Medicare Other | Admitting: Cardiovascular Disease

## 2016-08-14 ENCOUNTER — Ambulatory Visit: Payer: Medicare Other | Admitting: Cardiovascular Disease

## 2016-08-22 ENCOUNTER — Encounter: Payer: Self-pay | Admitting: *Deleted

## 2016-08-28 ENCOUNTER — Encounter: Payer: Self-pay | Admitting: Cardiology

## 2016-08-28 ENCOUNTER — Ambulatory Visit (INDEPENDENT_AMBULATORY_CARE_PROVIDER_SITE_OTHER): Payer: Medicare Other | Admitting: Cardiology

## 2016-08-28 VITALS — BP 155/79 | HR 89 | Ht 65.0 in | Wt 163.6 lb

## 2016-08-28 DIAGNOSIS — I493 Ventricular premature depolarization: Secondary | ICD-10-CM | POA: Diagnosis not present

## 2016-08-28 DIAGNOSIS — I351 Nonrheumatic aortic (valve) insufficiency: Secondary | ICD-10-CM | POA: Insufficient documentation

## 2016-08-28 DIAGNOSIS — I10 Essential (primary) hypertension: Secondary | ICD-10-CM

## 2016-08-28 DIAGNOSIS — R600 Localized edema: Secondary | ICD-10-CM | POA: Insufficient documentation

## 2016-08-28 DIAGNOSIS — R0609 Other forms of dyspnea: Secondary | ICD-10-CM

## 2016-08-28 DIAGNOSIS — R0600 Dyspnea, unspecified: Secondary | ICD-10-CM

## 2016-08-28 NOTE — Assessment & Plan Note (Signed)
I reviewed the echo with Dr Berry- no LVD or enlargement, unlikely to be causing her and dyspnea or edema 

## 2016-08-28 NOTE — Patient Instructions (Signed)
Your physician recommends that you schedule a follow-up appointment in: 3 MONTHS WITH DR BERRY  USE COMPRESSION HOSE DURING THE DAY AND REMOVE AT BEDTIME

## 2016-08-28 NOTE — Progress Notes (Signed)
08/28/2016 Amanda Bartlett   23-Mar-1935  [id number]  Primary Physician Corine Shelter, PA-C Primary Cardiologist: Dr Gwenlyn Found  HPI:  81 year old female who was referred to Dr Gwenlyn Found in Sept 2017 by Dr. Beryle Quant for cardiovascular evaluation because of fairly new onset dyspnea on exertion and auscultated outflow tract murmur. Her risk factors include treated hypertension, diabetes and hyperlipidemia. There is a family history of heart disease. She has never had a heart attack or stroke. She had noticed increasing dyspnea on exertion over the last year with with occasional atypical chest pain. Her primary care provider did auscultate a murmur. Dr Gwenlyn Found obtained a 2-D echocardiogram on 07/09/16 revealing normal LV size and function with mild to moderate aortic insufficiency. Myoview 07/31/16 was normal. She is in the office today for follow up. She complains of LE edema, worse by the end of the day, not present in the morning. She told me she read about AI and this it can cause heart failure and wondered if that was the cause of her LE edema.   Current Outpatient Prescriptions  Medication Sig Dispense Refill  . aspirin EC 81 MG tablet Take 81 mg by mouth daily.    . Coenzyme Q10 (CO Q-10) 100 MG CAPS Take 100 mg by mouth daily.    . furosemide (LASIX) 20 MG tablet Take 1 tablet (20 mg total) by mouth daily. 90 tablet 3  . glipiZIDE-metformin (METAGLIP) 5-500 MG tablet Take 1 tablet by mouth 2 (two) times daily.  1  . naproxen (NAPROSYN) 500 MG tablet Take 500 mg by mouth daily as needed for mild pain.    Marland Kitchen OLMESARTAN-AMLODIPINE-HCTZ PO Take 1 tablet by mouth daily. Olmesartan-Amlodipine-HCTZ 40-5-10 mg    . simvastatin (ZOCOR) 20 MG tablet Take 20 mg by mouth daily.  3   No current facility-administered medications for this visit.     Allergies  Allergen Reactions  . Codeine Nausea Only  . Prednisone Swelling    Social History   Social History  . Marital status: Married    Spouse name: N/A  .  Number of children: N/A  . Years of education: N/A   Occupational History  . Not on file.   Social History Main Topics  . Smoking status: Never Smoker  . Smokeless tobacco: Never Used  . Alcohol use Not on file  . Drug use: Unknown  . Sexual activity: Not on file   Other Topics Concern  . Not on file   Social History Narrative  . No narrative on file     Review of Systems: General: negative for chills, fever, night sweats or weight changes.  Cardiovascular: negative for chest pain, dyspnea on exertion, orthopnea, palpitations, paroxysmal nocturnal dyspnea or shortness of breath Dermatological: negative for rash Respiratory: negative for cough or wheezing, no hemoptysis, no calf pain Urologic: negative for hematuria Abdominal: negative for nausea, vomiting, diarrhea, bright red blood per rectum, melena, or hematemesis Neurologic: negative for visual changes, syncope, or dizziness All other systems reviewed and are otherwise negative except as noted above.    Blood pressure (!) 155/79, pulse 89, height 5\' 5"  (1.651 m), weight 163 lb 9.6 oz (74.2 kg).  General appearance: alert, cooperative and no distress Neck: no carotid bruit, no JVD and supple, symmetrical, trachea midline Lungs: clear to auscultation bilaterally Heart: regular rate and rhythm Extremities: 1-2+ pitting edema bilateral LE Skin: Skin color, texture, turgor normal. No rashes or lesions Neurologic: Grossly normal  EKG NSR, frequent PVCs  ASSESSMENT AND PLAN:  Edema of both legs Pt's primary complaint today is LE edema  Dyspnea on exertion Myoview and echo unremarkable  Essential hypertension Controlled  Mild aortic insufficiency I reviewed the echo with Dr Gwenlyn Found- no LVD or enlargement, unlikely to be causing her and dyspnea or edema  PVC's (premature ventricular contractions) Does not appear to be symptomatic   PLAN  I reviewed the Myoview and echo findings with Ms Kortan. I suggested she try  compression stockings. I also suggested the possibility Amlodipine could be contributing to her edema. She was not anxious to change her medications and I suggested she take this up with Dr Aron Baba.  Dr Gwenlyn Found will see her back in a few months, if her symptoms worsen or change she may need mor invasive testing such as a diagnostic cath or chest CTA to r/o PE.   Kerin Ransom PA-C 08/28/2016 1:20 PM

## 2016-08-28 NOTE — Assessment & Plan Note (Signed)
Controlled.  

## 2016-08-28 NOTE — Assessment & Plan Note (Signed)
Does not appear to be symptomatic

## 2016-08-28 NOTE — Assessment & Plan Note (Signed)
Pt's primary complaint today is LE edema

## 2016-08-28 NOTE — Assessment & Plan Note (Addendum)
Myoview and echo unremarkable

## 2016-10-26 ENCOUNTER — Encounter: Payer: Self-pay | Admitting: Cardiovascular Disease

## 2016-10-26 ENCOUNTER — Ambulatory Visit (INDEPENDENT_AMBULATORY_CARE_PROVIDER_SITE_OTHER): Payer: Medicare Other | Admitting: Cardiovascular Disease

## 2016-10-26 VITALS — BP 176/82 | HR 90 | Ht 60.0 in | Wt 162.0 lb

## 2016-10-26 DIAGNOSIS — R0609 Other forms of dyspnea: Secondary | ICD-10-CM | POA: Diagnosis not present

## 2016-10-26 DIAGNOSIS — I351 Nonrheumatic aortic (valve) insufficiency: Secondary | ICD-10-CM

## 2016-10-26 DIAGNOSIS — R0600 Dyspnea, unspecified: Secondary | ICD-10-CM

## 2016-10-26 DIAGNOSIS — I10 Essential (primary) hypertension: Secondary | ICD-10-CM

## 2016-10-26 DIAGNOSIS — I493 Ventricular premature depolarization: Secondary | ICD-10-CM

## 2016-10-26 DIAGNOSIS — E7800 Pure hypercholesterolemia, unspecified: Secondary | ICD-10-CM

## 2016-10-26 NOTE — Progress Notes (Signed)
10/26/2016 Amanda Bartlett   Sep 09, 1935  [id number]  Primary Physician Corine Shelter, PA-C Primary Cardiologist: Lorretta Harp MD Renae Gloss  HPI:  Amanda Bartlett is a 82 year old mildly overweight married Caucasian female mother of 41, grandmother of 57 grandchildren who worked at [employer] and [employer] during her working years. She was referred by Corine Shelter PA-C for cardiovascular evaluation because of fairly new onset dyspnea on exertion and auscultated outflow tract murmur. I last saw her in the office 07/24/16. Her risk factors include treated hypertension, diabetes and hyperlipidemia. There is a family history of heart disease. She has never had a heart attack or stroke. She's noticed increasing dyspnea on exertion over the last year with with occasional atypical chest pain. Her primary care provider did auscultate a murmur. I obtained a 2-D echocardiogram on 07/09/16 revealing normal LV size and function with mild to moderate aortic insufficiency. A Myoview stress test was normal as well. She was complaining of some edema which resolved with reduction of her amlodipine dose. She does keep a careful log of her blood pressures several times a day and is concerned about the fluctuation in her blood pressure and heart rate.   Current Outpatient Prescriptions  Medication Sig Dispense Refill  . aspirin EC 81 MG tablet Take 81 mg by mouth daily.    . Coenzyme Q10 (CO Q-10) 100 MG CAPS Take 100 mg by mouth daily.    Marland Kitchen glipiZIDE-metformin (METAGLIP) 5-500 MG tablet Take 1 tablet by mouth 2 (two) times daily.  1  . naproxen (NAPROSYN) 500 MG tablet Take 500 mg by mouth daily as needed for mild pain.    . simvastatin (ZOCOR) 20 MG tablet Take 20 mg by mouth daily.  3  . furosemide (LASIX) 20 MG tablet Take 1 tablet (20 mg total) by mouth daily. 90 tablet 3  . Olmesartan-Amlodipine-HCTZ 40-5-12.5 MG TABS Take 1 tablet by mouth daily.  0   No current facility-administered medications for this  visit.     Allergies  Allergen Reactions  . Codeine Nausea Only  . Prednisone Swelling    Social History   Social History  . Marital status: Married    Spouse name: N/A  . Number of children: N/A  . Years of education: N/A   Occupational History  . Not on file.   Social History Main Topics  . Smoking status: Never Smoker  . Smokeless tobacco: Never Used  . Alcohol use Not on file  . Drug use: Unknown  . Sexual activity: Not on file   Other Topics Concern  . Not on file   Social History Narrative  . No narrative on file     Review of Systems: General: negative for chills, fever, night sweats or weight changes.  Cardiovascular: negative for chest pain, dyspnea on exertion, edema, orthopnea, palpitations, paroxysmal nocturnal dyspnea or shortness of breath Dermatological: negative for rash Respiratory: negative for cough or wheezing Urologic: negative for hematuria Abdominal: negative for nausea, vomiting, diarrhea, bright red blood per rectum, melena, or hematemesis Neurologic: negative for visual changes, syncope, or dizziness All other systems reviewed and are otherwise negative except as noted above.    Blood pressure (!) 176/82, pulse 90, height 5' (1.524 m), weight 162 lb (73.5 kg).  General appearance: alert and no distress Neck: no adenopathy, no carotid bruit, no JVD, supple, symmetrical, trachea midline and thyroid not enlarged, symmetric, no tenderness/mass/nodules Lungs: clear to auscultation bilaterally Heart: regular rate and rhythm, S1, S2  normal, no murmur, click, rub or gallop Extremities: extremities normal, atraumatic, no cyanosis or edema  EKG sinus rhythm at 90 with left anterior fascicular block and occasional PVCs. I personally reviewed this EKG  ASSESSMENT AND PLAN:   Essential hypertension History of hypertension blood pressure is 176/82. She does take her blood pressures at home have reviewed her blood pressure log which have shown much  lower blood pressures. She is on Olmasartan, amlodipine and hydrochlorothiazide. Her amlodipine dose was cut in half and her edema resolved 3 days later.  Hyperlipidemia History of hyperlipidemia on simvastatin followed by her PCP  Dyspnea on exertion History of dyspnea on exertion with echo that showed normal LV systolic function with mild to moderate aortic insufficiency. She is on diuretics.  PVC's (premature ventricular contractions) Chronic  Mild aortic insufficiency History of mild to moderate aortic insufficiency insufficiency by 2-D echocardiogram with normal LV systolic function. We will continue to follow her to the echo on annual basis.      Lorretta Harp MD FACP,FACC,FAHA, [facility] 10/26/2016 11:21 AM

## 2016-10-26 NOTE — Assessment & Plan Note (Signed)
History of hypertension blood pressure is 176/82. She does take her blood pressures at home have reviewed her blood pressure log which have shown much lower blood pressures. She is on Olmasartan, amlodipine and hydrochlorothiazide. Her amlodipine dose was cut in half and her edema resolved 3 days later.

## 2016-10-26 NOTE — Assessment & Plan Note (Signed)
Chronic. 

## 2016-10-26 NOTE — Assessment & Plan Note (Signed)
History of dyspnea on exertion with echo that showed normal LV systolic function with mild to moderate aortic insufficiency. She is on diuretics.

## 2016-10-26 NOTE — Assessment & Plan Note (Signed)
History of hyperlipidemia on simvastatin followed by her PCP 

## 2016-10-26 NOTE — Patient Instructions (Signed)
Medication Instructions: Your physician recommends that you continue on your current medications as directed. Please refer to the Current Medication list given to you today.  Testing:  Your physician has requested that you have an echocardiogram in 1 year. Echocardiography is a painless test that uses sound waves to create images of your heart. It provides your doctor with information about the size and shape of your heart and how well your heart's chambers and valves are working. This procedure takes approximately one hour. There are no restrictions for this procedure.  Follow-Up: We request that you follow-up in: 6 months with Kerin Ransom, PA-C and in 12 months with Dr Andria Rhein will receive a reminder letter in the mail two months in advance. If you don't receive a letter, please call our office to schedule the follow-up appointment.  If you need a refill on your cardiac medications before your next appointment, please call your pharmacy.

## 2016-10-26 NOTE — Assessment & Plan Note (Signed)
History of mild to moderate aortic insufficiency insufficiency by 2-D echocardiogram with normal LV systolic function. We will continue to follow her to the echo on annual basis.

## 2016-11-26 DIAGNOSIS — E119 Type 2 diabetes mellitus without complications: Secondary | ICD-10-CM | POA: Diagnosis not present

## 2016-11-26 DIAGNOSIS — I10 Essential (primary) hypertension: Secondary | ICD-10-CM | POA: Diagnosis not present

## 2017-04-04 DIAGNOSIS — Z7984 Long term (current) use of oral hypoglycemic drugs: Secondary | ICD-10-CM | POA: Diagnosis not present

## 2017-04-04 DIAGNOSIS — Z0000 Encounter for general adult medical examination without abnormal findings: Secondary | ICD-10-CM | POA: Diagnosis not present

## 2017-04-04 DIAGNOSIS — E119 Type 2 diabetes mellitus without complications: Secondary | ICD-10-CM | POA: Diagnosis not present

## 2017-04-29 ENCOUNTER — Ambulatory Visit (INDEPENDENT_AMBULATORY_CARE_PROVIDER_SITE_OTHER): Payer: Medicare Other | Admitting: Cardiology

## 2017-04-29 ENCOUNTER — Encounter: Payer: Self-pay | Admitting: Cardiology

## 2017-04-29 VITALS — BP 160/62 | HR 79 | Ht 60.0 in | Wt 160.4 lb

## 2017-04-29 DIAGNOSIS — I10 Essential (primary) hypertension: Secondary | ICD-10-CM | POA: Diagnosis not present

## 2017-04-29 DIAGNOSIS — E119 Type 2 diabetes mellitus without complications: Secondary | ICD-10-CM | POA: Diagnosis not present

## 2017-04-29 DIAGNOSIS — R600 Localized edema: Secondary | ICD-10-CM

## 2017-04-29 DIAGNOSIS — I493 Ventricular premature depolarization: Secondary | ICD-10-CM | POA: Diagnosis not present

## 2017-04-29 DIAGNOSIS — E785 Hyperlipidemia, unspecified: Secondary | ICD-10-CM | POA: Diagnosis not present

## 2017-04-29 DIAGNOSIS — I351 Nonrheumatic aortic (valve) insufficiency: Secondary | ICD-10-CM | POA: Diagnosis not present

## 2017-04-29 MED ORDER — CARVEDILOL 3.125 MG PO TABS: 3.1250 mg | ORAL_TABLET | Freq: Two times a day (BID) | ORAL | 6 refills | Status: DC

## 2017-04-29 NOTE — Assessment & Plan Note (Signed)
On statin Rx, followed by PCP 

## 2017-04-29 NOTE — Assessment & Plan Note (Signed)
Not optimally controlled

## 2017-04-29 NOTE — Assessment & Plan Note (Addendum)
Followed by her PCP-Glucophage changed to Actos secondary to renal insufficiency per pt

## 2017-04-29 NOTE — Progress Notes (Addendum)
04/29/2017 Amanda Bartlett   [birth year]  [id number]  Primary Physician Corine Shelter, PA-C Primary Cardiologist: Dr Gwenlyn Found  HPI: 82 year-old female who was referred to Dr Gwenlyn Found in Sept 2017 by Bishop Dublin for cardiovascular evaluation because of fairly new onset dyspnea on exertion and auscultated outflow tract murmur. Her risk factors include treated hypertension, diabetes and hyperlipidemia. There is a family history of heart disease. She has never had a heart attack or stroke. She had noticed increasing dyspnea on exertion over the previous year with occasional atypical chest pain. Her primary care provider did auscultate a murmur. Dr Gwenlyn Found obtained a 2-D echocardiogram on 07/09/16 revealing normal LV size and function with mild to moderate aortic insufficiency. Myoview 07/31/16 was normal. She had some LE edema that resolved with decreasing her amlodipine. She is here for a 6 month check up. She denies any chest pain or unusual dyspnea. She denies any palpitations, tachycardia, syncope, or near syncope.     Current Outpatient Prescriptions  Medication Sig Dispense Refill  . aspirin EC 81 MG tablet Take 81 mg by mouth daily.    . Coenzyme Q10 (CO Q-10) 100 MG CAPS Take 100 mg by mouth daily.    . furosemide (LASIX) 20 MG tablet Take 20 mg by mouth every other day.  3  . glipiZIDE (GLUCOTROL XL) 10 MG 24 hr tablet Take 10 mg by mouth daily.  0  . naproxen (NAPROSYN) 500 MG tablet Take 500 mg by mouth daily as needed for mild pain.    . Olmesartan-Amlodipine-HCTZ 40-5-12.5 MG TABS Take 1 tablet by mouth daily.  0  . pioglitazone (ACTOS) 15 MG tablet Take 15 mg by mouth daily.  0  . simvastatin (ZOCOR) 20 MG tablet Take 20 mg by mouth daily.  3  . carvedilol (COREG) 3.125 MG tablet Take 1 tablet (3.125 mg total) by mouth 2 (two) times daily with a meal. 60 tablet 6   No current facility-administered medications for this visit.     Allergies  Allergen Reactions  . Codeine Nausea Only  .  Prednisone Swelling    No past medical history on file.  Social History   Social History  . Marital status: Married    Spouse name: N/A  . Number of children: N/A  . Years of education: N/A   Occupational History  . Not on file.   Social History Main Topics  . Smoking status: Never Smoker  . Smokeless tobacco: Never Used  . Alcohol use Not on file  . Drug use: Unknown  . Sexual activity: Not on file   Other Topics Concern  . Not on file   Social History Narrative  . No narrative on file      Review of Systems: General: negative for chills, fever, night sweats or weight changes.  Cardiovascular: negative for chest pain, dyspnea on exertion, edema, orthopnea, palpitations, paroxysmal nocturnal dyspnea or shortness of breath Dermatological: negative for rash Respiratory: negative for cough or wheezing Urologic: negative for hematuria Abdominal: negative for nausea, vomiting, diarrhea, bright red blood per rectum, melena, or hematemesis Neurologic: negative for visual changes, syncope, or dizziness All other systems reviewed and are otherwise negative except as noted above.    Blood pressure (!) 160/62, pulse 79, height 5' (1.524 m), weight 160 lb 6.4 oz (72.8 kg).  General appearance: alert, cooperative and no distress Neck: no carotid bruit and no JVD Lungs: clear to auscultation bilaterally Heart: regular rate and rhythm and frequent extra systole  Extremities: extremities normal, atraumatic, no cyanosis or edema Skin: Skin color, texture, turgor normal. No rashes or lesions Neurologic: Grossly normal  EKG NSR,   ASSESSMENT AND PLAN:   Essential hypertension Not optimally controlled  PVC's (premature ventricular contractions) Multiple asymptomatic PVCs in the office today  Edema of both legs Resolved with decreasing her amlodipine  Non-insulin treated type 2 diabetes mellitus ([place]) Followed by her PCP-Glucophage changed to Actos secondary to renal  insufficiency per pt  Dyslipidemia On statin Rx, followed by PCP  Mild aortic insufficiency I reviewed the echo with Dr Gwenlyn Found- no LVD or enlargement, unlikely to be causing her and dyspnea or edema   PLAN  I added Coreg 3.125 mg BID for HTN, PVCs, and DM. She will f/u with her PCP. We'll see her back in 6 months. We may want to repeat her echo at that time. I will request labs from PCP to document her renal function  Kerin Ransom PA-C 04/29/2017 11:53 AM    Addendum: Labs from Feb 2018- BUN 14/ SCr 1.03

## 2017-04-29 NOTE — Patient Instructions (Signed)
Medication Instructions:  START carvedilol (Coreg) 3.125 mg (1 tablet) two times daily  Labwork: NONE  Testing/Procedures: NONE  Follow-Up: Your physician wants you to follow-up in: 6 MONTHS with Dr. Gwenlyn Found.  You will receive a reminder letter in the mail two months in advance. If you don't receive a letter, please call our office to schedule the follow-up appointment.   Any Other Special Instructions Will Be Listed Below (If Applicable).     If you need a refill on your cardiac medications before your next appointment, please call your pharmacy.

## 2017-04-29 NOTE — Assessment & Plan Note (Signed)
Resolved with decreasing her amlodipine

## 2017-04-29 NOTE — Assessment & Plan Note (Signed)
I reviewed the echo with Dr Gwenlyn Found- no LVD or enlargement, unlikely to be causing her and dyspnea or edema

## 2017-04-29 NOTE — Assessment & Plan Note (Addendum)
Multiple asymptomatic PVCs in the office today

## 2017-05-20 DIAGNOSIS — E119 Type 2 diabetes mellitus without complications: Secondary | ICD-10-CM | POA: Diagnosis not present

## 2017-05-20 DIAGNOSIS — E7800 Pure hypercholesterolemia, unspecified: Secondary | ICD-10-CM | POA: Diagnosis not present

## 2017-05-20 DIAGNOSIS — I10 Essential (primary) hypertension: Secondary | ICD-10-CM | POA: Diagnosis not present

## 2017-05-20 DIAGNOSIS — Z7984 Long term (current) use of oral hypoglycemic drugs: Secondary | ICD-10-CM | POA: Diagnosis not present

## 2017-06-18 DIAGNOSIS — Z23 Encounter for immunization: Secondary | ICD-10-CM | POA: Diagnosis not present

## 2017-07-11 ENCOUNTER — Other Ambulatory Visit: Payer: Self-pay | Admitting: Cardiovascular Disease

## 2017-07-11 NOTE — Telephone Encounter (Signed)
REFILL 

## 2017-07-25 DIAGNOSIS — E119 Type 2 diabetes mellitus without complications: Secondary | ICD-10-CM | POA: Diagnosis not present

## 2017-07-25 DIAGNOSIS — H52203 Unspecified astigmatism, bilateral: Secondary | ICD-10-CM | POA: Diagnosis not present

## 2017-07-25 DIAGNOSIS — Z961 Presence of intraocular lens: Secondary | ICD-10-CM | POA: Diagnosis not present

## 2017-10-12 ENCOUNTER — Other Ambulatory Visit: Payer: Self-pay | Admitting: Cardiology

## 2017-10-12 DIAGNOSIS — I10 Essential (primary) hypertension: Secondary | ICD-10-CM

## 2017-10-14 NOTE — Telephone Encounter (Signed)
Rx(s) sent to pharmacy electronically.  

## 2017-10-28 ENCOUNTER — Encounter (INDEPENDENT_AMBULATORY_CARE_PROVIDER_SITE_OTHER): Payer: Self-pay

## 2017-10-28 ENCOUNTER — Ambulatory Visit (HOSPITAL_COMMUNITY): Payer: Medicare Other | Attending: Cardiology

## 2017-10-28 ENCOUNTER — Other Ambulatory Visit: Payer: Self-pay

## 2017-10-28 DIAGNOSIS — E785 Hyperlipidemia, unspecified: Secondary | ICD-10-CM | POA: Insufficient documentation

## 2017-10-28 DIAGNOSIS — I351 Nonrheumatic aortic (valve) insufficiency: Secondary | ICD-10-CM | POA: Diagnosis not present

## 2017-10-28 DIAGNOSIS — Z8249 Family history of ischemic heart disease and other diseases of the circulatory system: Secondary | ICD-10-CM | POA: Diagnosis not present

## 2017-10-28 DIAGNOSIS — I119 Hypertensive heart disease without heart failure: Secondary | ICD-10-CM | POA: Insufficient documentation

## 2017-10-28 DIAGNOSIS — E119 Type 2 diabetes mellitus without complications: Secondary | ICD-10-CM | POA: Diagnosis not present

## 2017-10-28 DIAGNOSIS — I081 Rheumatic disorders of both mitral and tricuspid valves: Secondary | ICD-10-CM | POA: Insufficient documentation

## 2017-10-29 ENCOUNTER — Encounter: Payer: Self-pay | Admitting: Cardiovascular Disease

## 2017-10-29 ENCOUNTER — Ambulatory Visit (INDEPENDENT_AMBULATORY_CARE_PROVIDER_SITE_OTHER): Payer: Medicare Other | Admitting: Cardiovascular Disease

## 2017-10-29 VITALS — BP 140/70 | HR 64 | Ht 60.0 in | Wt 171.0 lb

## 2017-10-29 DIAGNOSIS — I351 Nonrheumatic aortic (valve) insufficiency: Secondary | ICD-10-CM

## 2017-10-29 DIAGNOSIS — I10 Essential (primary) hypertension: Secondary | ICD-10-CM

## 2017-10-29 DIAGNOSIS — R0609 Other forms of dyspnea: Secondary | ICD-10-CM | POA: Diagnosis not present

## 2017-10-29 DIAGNOSIS — R0600 Dyspnea, unspecified: Secondary | ICD-10-CM

## 2017-10-29 NOTE — Assessment & Plan Note (Signed)
History of dyslipidemia on statin therapy with recent lipid profile performed by her PCP 05/20/17. Total cholesterol 146, LDL 62 and HDL of 50

## 2017-10-29 NOTE — Progress Notes (Signed)
10/29/2017 Amanda Bartlett   1935/01/18  [id number]  Primary Physician Starlyn Skeans, PA-C Primary Cardiologist: Lorretta Harp MD [name], [place]  HPI:  Amanda Bartlett is a 83 y.o.  mildly overweight married Caucasian female mother of 85, grandmother of 92 grandchildren who worked at [employer] and [employer] during her working years. She was referred by Corine Shelter PA-C for cardiovascular evaluation because of fairly new onset dyspnea on exertion and auscultated outflow tract murmur. I last saw her in the office 10/26/16. Her risk factors include treated hypertension, diabetes and hyperlipidemia. There is a family history of heart disease. She has never had a heart attack or stroke. She's noticed increasing dyspnea on exertion over the last year with with occasional atypical chest pain. Her primary care provider did auscultate a murmur. I obtained a 2-D echocardiogram on 07/09/16 revealing normal LV size and function with mild to moderate aortic insufficiency. A Myoview stress test was normal as well. She was complaining of some edema which resolved with reduction of her amlodipine dose. She does keep a careful log of her blood pressures several times a day and is concerned about the fluctuation in her blood pressure and heart rate. Since I saw her a year ago she has seen Kerin Ransom in the office 04/29/17. She is doing well. Her edema somewhat improved with reduction in her amlodipine. She is on a low-dose diuretic. Recent 2-D echo performed 10/20/17 revealed normal LV size and function, grade 1 diastolic dysfunction with mild AI and mildly increased pulmonary artery pressures. She is on Actos which may be contributing to her ankle edema as well.     Current Meds  Medication Sig  . aspirin EC 81 MG tablet Take 81 mg by mouth daily.  . carvedilol (COREG) 3.125 MG tablet Take 1 tablet (3.125 mg total) by mouth 2 (two) times daily with a meal.  . Coenzyme Q10 (CO Q-10) 100 MG CAPS Take 100 mg by  mouth daily.  . furosemide (LASIX) 20 MG tablet TAKE 1 TABLET BY MOUTH EVERY DAY (Patient taking differently: TAKE 1 TABLET BY MOUTH EVERY OTHER DAY.)  . glipiZIDE (GLUCOTROL XL) 10 MG 24 hr tablet Take 10 mg by mouth daily.  . naproxen (NAPROSYN) 500 MG tablet Take 500 mg by mouth daily as needed for mild pain.  . Olmesartan-Amlodipine-HCTZ 40-5-12.5 MG TABS Take 1 tablet by mouth daily.  . pioglitazone (ACTOS) 15 MG tablet Take 15 mg by mouth daily.  . simvastatin (ZOCOR) 20 MG tablet Take 20 mg by mouth daily.     Allergies  Allergen Reactions  . Codeine Nausea Only  . Prednisone Swelling    Social History   Socioeconomic History  . Marital status: Married    Spouse name: Not on file  . Number of children: Not on file  . Years of education: Not on file  . Highest education level: Not on file  Social Needs  . Financial resource strain: Not on file  . Food insecurity - worry: Not on file  . Food insecurity - inability: Not on file  . Transportation needs - medical: Not on file  . Transportation needs - non-medical: Not on file  Occupational History  . Not on file  Tobacco Use  . Smoking status: Never Smoker  . Smokeless tobacco: Never Used  Substance and Sexual Activity  . Alcohol use: Not on file  . Drug use: Not on file  . Sexual activity: Not on file  Other Topics Concern  . Not on file  Social History Narrative  . Not on file     Review of Systems: General: negative for chills, fever, night sweats or weight changes.  Cardiovascular: negative for chest pain, dyspnea on exertion, edema, orthopnea, palpitations, paroxysmal nocturnal dyspnea or shortness of breath Dermatological: negative for rash Respiratory: negative for cough or wheezing Urologic: negative for hematuria Abdominal: negative for nausea, vomiting, diarrhea, bright red blood per rectum, melena, or hematemesis Neurologic: negative for visual changes, syncope, or dizziness All other systems reviewed  and are otherwise negative except as noted above.    Blood pressure 140/70, pulse 64, height 5' (1.524 m), weight 171 lb (77.6 kg).  General appearance: alert and no distress Neck: no adenopathy, no carotid bruit, no JVD, supple, symmetrical, trachea midline and thyroid not enlarged, symmetric, no tenderness/mass/nodules Lungs: clear to auscultation bilaterally Heart: regular rate and rhythm, S1, S2 normal, no murmur, click, rub or gallop Extremities: extremities normal, atraumatic, no cyanosis or edema Pulses: 2+ and symmetric Skin: Skin color, texture, turgor normal. No rashes or lesions Neurologic: Alert and oriented X 3, normal strength and tone. Normal symmetric reflexes. Normal coordination and gait  EKG sinus rhythm at 64 with trigeminal PVCs. I personally reviewed this EKG.  ASSESSMENT AND PLAN:   Essential hypertension History of essential hypertension the blood pressure measures 140/70. She is on carvedilol, olmesartan and hydrochlorothiazide/amlodipine. Continue current meds at current dosing  Dyslipidemia History of dyslipidemia on statin therapy with recent lipid profile performed by her PCP 05/20/17. Total cholesterol 146, LDL 62 and HDL of 50  Dyspnea on exertion History of dyspnea on exertion with mild a 2-D echo and grade 1 diastolic dysfunction. She is on low-dose diuretic.      Lorretta Harp MD FACP,FACC,FAHA, [facility] 10/29/2017 9:56 AM

## 2017-10-29 NOTE — Assessment & Plan Note (Signed)
History of essential hypertension the blood pressure measures 140/70. She is on carvedilol, olmesartan and hydrochlorothiazide/amlodipine. Continue current meds at current dosing

## 2017-10-29 NOTE — Assessment & Plan Note (Signed)
History of dyspnea on exertion with mild a 2-D echo and grade 1 diastolic dysfunction. She is on low-dose diuretic.

## 2017-10-29 NOTE — Patient Instructions (Signed)
Medication Instructions: Your physician recommends that you continue on your current medications as directed. Please refer to the Current Medication list given to you today.   Testing/Procedures:  In 1 year: Your physician has requested that you have an echocardiogram. Echocardiography is a painless test that uses sound waves to create images of your heart. It provides your doctor with information about the size and shape of your heart and how well your heart's chambers and valves are working. This procedure takes approximately one hour. There are no restrictions for this procedure.   Follow-Up: Your physician wants you to follow-up in: 1 year with Dr. Gwenlyn Found (AFTER ECHOCARDIOGRAM). You will receive a reminder letter in the mail two months in advance. If you don't receive a letter, please call our office to schedule the follow-up appointment.  If you need a refill on your cardiac medications before your next appointment, please call your pharmacy.

## 2017-11-12 DIAGNOSIS — L7211 Pilar cyst: Secondary | ICD-10-CM | POA: Diagnosis not present

## 2017-11-18 DIAGNOSIS — E119 Type 2 diabetes mellitus without complications: Secondary | ICD-10-CM | POA: Diagnosis not present

## 2017-11-18 DIAGNOSIS — M25512 Pain in left shoulder: Secondary | ICD-10-CM | POA: Diagnosis not present

## 2017-11-18 DIAGNOSIS — J309 Allergic rhinitis, unspecified: Secondary | ICD-10-CM | POA: Diagnosis not present

## 2017-11-18 DIAGNOSIS — Z7984 Long term (current) use of oral hypoglycemic drugs: Secondary | ICD-10-CM | POA: Diagnosis not present

## 2017-11-18 DIAGNOSIS — E7800 Pure hypercholesterolemia, unspecified: Secondary | ICD-10-CM | POA: Diagnosis not present

## 2017-11-18 DIAGNOSIS — I10 Essential (primary) hypertension: Secondary | ICD-10-CM | POA: Diagnosis not present

## 2017-12-05 DIAGNOSIS — M47816 Spondylosis without myelopathy or radiculopathy, lumbar region: Secondary | ICD-10-CM | POA: Diagnosis not present

## 2017-12-06 ENCOUNTER — Telehealth: Payer: Self-pay | Admitting: *Deleted

## 2017-12-06 NOTE — Telephone Encounter (Signed)
Patient calling refill line to have Dr. Gwenlyn Found or his nurse call her back about her recent swelling in her ankles. She was told to discontinue a blood pressure medication but is still having lower leg edema and believes it is from the Carvedilol that replaced last medication.  Would like to know is there another medication she can take and would like a call back on what to do.

## 2017-12-06 NOTE — Telephone Encounter (Signed)
Spoke with patient and she stated she has been having weight gain and swelling in her feet for a while. Per patient Dr Gwenlyn Found thought Actos could be contributing to it so her PCP discontinued. She continues to have the swelling and weight gain and read side effects of Carvedilol and they are listed. She did start the Carvedilol after seeing Lurena Joiner in July. States she has gained 12 pounds in a week. Denies any change in her breathing. Advised patient she needed to be seen as that is a significant amount of weight in a week. She stated she was just seen and does not feel she needs to be seen since nothing any different except weight gain. Explained that she has been taking the Carvedilol and it was unlikely for that to be the cause of that amount of weight gain in a weeks time. Per patient she had decreased her Lasix to every other day at the recommendation of her PCP based on kidney functions about 6 months ago. She went back to the Lasix daily about 2-3 weeks ago. Again I explained she should be seen to evaluate the weight gain but she does not want an appointment she just wants Dr Kennon Holter recommendations. Advised would let him know however he may recommend visit. Again she denies any change in her breathing. Will forward to Dr Gwenlyn Found for review

## 2017-12-10 NOTE — Telephone Encounter (Signed)
Probably can see Amanda Bartlett back in the office for evaluation

## 2017-12-12 NOTE — Telephone Encounter (Signed)
Returned call to patient with MD advice of scheduling an appointment. She states she does not "think this is wise" to be seen again for a problem she had when she saw Dr. Gwenlyn Found. She states he stopped her Actos thinking this may have been the cause, however her symptoms persisted. She states carvedilol has the SE of swelling and sudden weight gain, which she states she has. Explained that I cannot advise her to stop a cardiac medication and I can only provide the MD's recommendations, of which she requested her previous note on 3/8. She declined scheduling an appointment at this time. She will think about it and call back.

## 2017-12-12 NOTE — Telephone Encounter (Signed)
Follow up    Patient is calling back in reference to Dr. Gwenlyn Found recommendations for her weight gain and swelling. Please call.

## 2017-12-19 DIAGNOSIS — M47816 Spondylosis without myelopathy or radiculopathy, lumbar region: Secondary | ICD-10-CM | POA: Diagnosis not present

## 2017-12-21 DIAGNOSIS — H01003 Unspecified blepharitis right eye, unspecified eyelid: Secondary | ICD-10-CM | POA: Diagnosis not present

## 2017-12-24 DIAGNOSIS — B029 Zoster without complications: Secondary | ICD-10-CM | POA: Diagnosis not present

## 2017-12-31 DIAGNOSIS — B0239 Other herpes zoster eye disease: Secondary | ICD-10-CM | POA: Diagnosis not present

## 2017-12-31 DIAGNOSIS — B0230 Zoster ocular disease, unspecified: Secondary | ICD-10-CM | POA: Diagnosis not present

## 2018-01-01 DIAGNOSIS — B0239 Other herpes zoster eye disease: Secondary | ICD-10-CM | POA: Diagnosis not present

## 2018-01-03 DIAGNOSIS — B0239 Other herpes zoster eye disease: Secondary | ICD-10-CM | POA: Diagnosis not present

## 2018-01-10 DIAGNOSIS — B0239 Other herpes zoster eye disease: Secondary | ICD-10-CM | POA: Diagnosis not present

## 2018-01-23 DIAGNOSIS — B0229 Other postherpetic nervous system involvement: Secondary | ICD-10-CM | POA: Diagnosis not present

## 2018-02-21 DIAGNOSIS — B0239 Other herpes zoster eye disease: Secondary | ICD-10-CM | POA: Diagnosis not present

## 2018-02-21 DIAGNOSIS — H40051 Ocular hypertension, right eye: Secondary | ICD-10-CM | POA: Diagnosis not present

## 2018-03-12 DIAGNOSIS — L7211 Pilar cyst: Secondary | ICD-10-CM | POA: Diagnosis not present

## 2018-03-20 DIAGNOSIS — L7211 Pilar cyst: Secondary | ICD-10-CM | POA: Diagnosis not present

## 2018-03-21 DIAGNOSIS — L7211 Pilar cyst: Secondary | ICD-10-CM | POA: Diagnosis not present

## 2018-03-24 DIAGNOSIS — B0239 Other herpes zoster eye disease: Secondary | ICD-10-CM | POA: Diagnosis not present

## 2018-04-02 ENCOUNTER — Other Ambulatory Visit: Payer: Self-pay | Admitting: Cardiovascular Disease

## 2018-04-02 DIAGNOSIS — I10 Essential (primary) hypertension: Secondary | ICD-10-CM

## 2018-05-08 IMAGING — NM NM MISC PROCEDURE
4 series · 24 of 24 positions shown · non-contrast
Comparison: none

[Series 1: wbr_r-proj_st wbr rest · 6.40mm/px · 6 of 64 frames shown]
[frame 6/64]
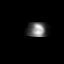
[frame 16/64]
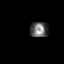
[frame 27/64]
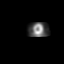
[frame 38/64]
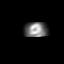
[frame 48/64]
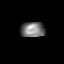
[frame 59/64]
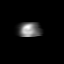

[Series 1: wbr rest · 6.40mm/px · 6 of 64 frames shown]
[frame 6/64]
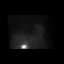
[frame 16/64]
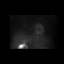
[frame 27/64]
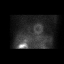
[frame 38/64]
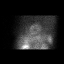
[frame 48/64]
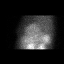
[frame 59/64]
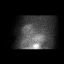

[Series 2: wbr_s-proj_st wbr stress ng · 6.40mm/px · 6 of 64 frames shown]
[frame 6/64]
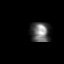
[frame 16/64]
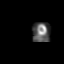
[frame 27/64]
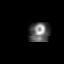
[frame 38/64]
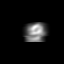
[frame 48/64]
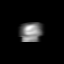
[frame 59/64]
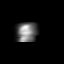

[Series 2: wbr stress ng · 6.40mm/px · 6 of 64 frames shown]
[frame 6/64]
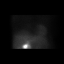
[frame 16/64]
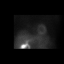
[frame 27/64]
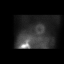
[frame 38/64]
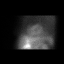
[frame 48/64]
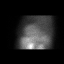
[frame 59/64]
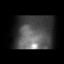

[24 of 24 positions shown; findings below may reference images not displayed]

Canned report from images found in remote index.

Refer to host system for actual result text.

## 2018-05-13 DIAGNOSIS — E119 Type 2 diabetes mellitus without complications: Secondary | ICD-10-CM | POA: Diagnosis not present

## 2018-05-13 DIAGNOSIS — Z7984 Long term (current) use of oral hypoglycemic drugs: Secondary | ICD-10-CM | POA: Diagnosis not present

## 2018-05-13 DIAGNOSIS — I10 Essential (primary) hypertension: Secondary | ICD-10-CM | POA: Diagnosis not present

## 2018-05-13 DIAGNOSIS — E7800 Pure hypercholesterolemia, unspecified: Secondary | ICD-10-CM | POA: Diagnosis not present

## 2018-05-13 DIAGNOSIS — Z0000 Encounter for general adult medical examination without abnormal findings: Secondary | ICD-10-CM | POA: Diagnosis not present

## 2018-07-07 DIAGNOSIS — B0239 Other herpes zoster eye disease: Secondary | ICD-10-CM | POA: Diagnosis not present

## 2018-07-07 DIAGNOSIS — E119 Type 2 diabetes mellitus without complications: Secondary | ICD-10-CM | POA: Diagnosis not present

## 2018-07-07 DIAGNOSIS — H04121 Dry eye syndrome of right lacrimal gland: Secondary | ICD-10-CM | POA: Diagnosis not present

## 2018-07-07 DIAGNOSIS — H5202 Hypermetropia, left eye: Secondary | ICD-10-CM | POA: Diagnosis not present

## 2018-07-08 ENCOUNTER — Other Ambulatory Visit: Payer: Self-pay | Admitting: Cardiovascular Disease

## 2018-07-21 DIAGNOSIS — Z23 Encounter for immunization: Secondary | ICD-10-CM | POA: Diagnosis not present

## 2018-08-18 DIAGNOSIS — B0239 Other herpes zoster eye disease: Secondary | ICD-10-CM | POA: Diagnosis not present

## 2018-09-22 DIAGNOSIS — B0239 Other herpes zoster eye disease: Secondary | ICD-10-CM | POA: Diagnosis not present

## 2018-11-13 DIAGNOSIS — I10 Essential (primary) hypertension: Secondary | ICD-10-CM | POA: Diagnosis not present

## 2018-11-13 DIAGNOSIS — E7800 Pure hypercholesterolemia, unspecified: Secondary | ICD-10-CM | POA: Diagnosis not present

## 2018-11-13 DIAGNOSIS — E119 Type 2 diabetes mellitus without complications: Secondary | ICD-10-CM | POA: Diagnosis not present

## 2018-12-01 ENCOUNTER — Ambulatory Visit (HOSPITAL_COMMUNITY): Payer: Medicare HMO | Attending: Internal Medicine

## 2018-12-01 DIAGNOSIS — I351 Nonrheumatic aortic (valve) insufficiency: Secondary | ICD-10-CM | POA: Insufficient documentation

## 2018-12-01 DIAGNOSIS — I10 Essential (primary) hypertension: Secondary | ICD-10-CM | POA: Insufficient documentation

## 2018-12-08 DIAGNOSIS — N289 Disorder of kidney and ureter, unspecified: Secondary | ICD-10-CM | POA: Diagnosis not present

## 2018-12-18 ENCOUNTER — Other Ambulatory Visit: Payer: Self-pay

## 2018-12-23 ENCOUNTER — Ambulatory Visit: Payer: Medicare Other | Admitting: Cardiovascular Disease

## 2018-12-24 ENCOUNTER — Other Ambulatory Visit: Payer: Self-pay | Admitting: Cardiovascular Disease

## 2018-12-24 DIAGNOSIS — I10 Essential (primary) hypertension: Secondary | ICD-10-CM

## 2019-02-07 ENCOUNTER — Other Ambulatory Visit: Payer: Self-pay | Admitting: Cardiovascular Disease

## 2019-02-09 NOTE — Telephone Encounter (Signed)
Furosemide refilled. 

## 2019-02-11 DIAGNOSIS — B0239 Other herpes zoster eye disease: Secondary | ICD-10-CM | POA: Diagnosis not present

## 2019-02-19 ENCOUNTER — Telehealth: Payer: Self-pay | Admitting: Cardiovascular Disease

## 2019-02-19 NOTE — Telephone Encounter (Signed)
Home phone/Patient is ok with Tele Visit, not Video/ consent/ my chart declined/pre reg completed

## 2019-02-24 ENCOUNTER — Telehealth (INDEPENDENT_AMBULATORY_CARE_PROVIDER_SITE_OTHER): Payer: Medicare HMO | Admitting: Cardiovascular Disease

## 2019-02-24 ENCOUNTER — Telehealth: Payer: Self-pay

## 2019-02-24 DIAGNOSIS — E785 Hyperlipidemia, unspecified: Secondary | ICD-10-CM

## 2019-02-24 DIAGNOSIS — I10 Essential (primary) hypertension: Secondary | ICD-10-CM

## 2019-02-24 DIAGNOSIS — I351 Nonrheumatic aortic (valve) insufficiency: Secondary | ICD-10-CM

## 2019-02-24 NOTE — Progress Notes (Signed)
Virtual Visit via Telephone Note   This visit type was conducted due to national recommendations for restrictions regarding the COVID-19 Pandemic (e.g. social distancing) in an effort to limit this patient's exposure and mitigate transmission in our community.  Due to her co-morbid illnesses, this patient is at least at moderate risk for complications without adequate follow up.  This format is felt to be most appropriate for this patient at this time.  The patient did not have access to video technology/had technical difficulties with video requiring transitioning to audio format only (telephone).  All issues noted in this document were discussed and addressed.  No physical exam could be performed with this format.  Please refer to the patient's chart for her 84 consent to telehealth for [facility].   Date:  02/24/2019   ID:  Amanda Bartlett, DOB 04/30/1935, MRN [id number]  Patient Location: Home Provider Location: Home  PCP:  Camille Bal, PA-C  Cardiologist: Dr. Quay Burow Electrophysiologist:  None   Evaluation Performed:  Follow-Up Visit  Chief Complaint: Follow-up 1 year  History of Present Illness:    Amanda Bartlett is a 84 y.o.  mildly overweight married Caucasian female mother of 59, grandmother of 64 grandchildren who worked at [employer] and [employer] during her working years. She was referred byMark Hepler PA-Cfor cardiovascular evaluation because of fairly new onset dyspnea on exertion and auscultated outflow tract murmur. I last saw her in the office  1/29/2019her risk factors include treated hypertension, diabetes and hyperlipidemia. There is a family history of heart disease. She has never had a heart attack or stroke. She's noticed increasing dyspnea on exertion over the last year with with occasional atypical chest pain. Her primary care provider did auscultate a murmur. I obtained a 2-D echocardiogram on 07/09/16 revealing normal LV size and function with mild to moderate  aortic insufficiency.A Myoview stress test was normal as well. She was complaining of some edema which resolved with reduction of her amlodipine dose. She does keep a careful log of her blood pressures several times a day and is concerned about the fluctuation in her blood pressure and heart rate. Since I saw her a year ago she has seen Kerin Ransom in the office 04/29/17. She is doing well. Her edema somewhat improved with reduction in her amlodipine. She is on a low-dose diuretic. 2-D echo performed 10/20/17 revealed normal LV size and function, grade 1 diastolic dysfunction with mild AI and mildly increased pulmonary artery pressures. She is on Actos which may be contributing to her ankle edema as well.  Since I saw her in the office a year and a half ago she has done well from a heart point of view.  She unfortunately did have shingles involving her face and eye which she is just getting over.  She walks 20 minutes at a time twice a day 7 days a week without limitation.  Recent 2D echo performed 12/01/2018 revealed normal LV systolic function with mild aortic insufficiency.  Patient denies chest pain or shortness of breath.  The patient does not have symptoms concerning for COVID-19 infection (fever, chills, cough, or new shortness of breath).    No past medical history on file. No past surgical history on file.   No outpatient medications have been marked as taking for the 02/24/19 encounter (Appointment) with Lorretta Harp, MD.     Allergies:   Codeine and Prednisone   Social History   Tobacco Use   Smoking status: Never  Smoker   Smokeless tobacco: Never Used  Substance Use Topics   Alcohol use: Not on file   Drug use: Not on file     Family Hx: The patient's family history is not on file.  ROS:   Please see the history of present illness.     All other systems reviewed and are negative.   Prior CV studies:   The following studies were reviewed today:  2D echocardiogram  performed 12/01/2018  Labs/Other Tests and Data Reviewed:    EKG:  No ECG reviewed.  Recent Labs: No results found for requested labs within last 8760 hours.   Recent Lipid Panel No results found for: CHOL, TRIG, HDL, CHOLHDL, LDLCALC, LDLDIRECT  Wt Readings from Last 3 Encounters:  10/29/17 171 lb (77.6 kg)  04/29/17 160 lb 6.4 oz (72.8 kg)  10/26/16 162 lb (73.5 kg)     Objective:    Vital Signs:  There were no vitals taken for this visit.   VITAL SIGNS:  reviewed a complete physical exam was not performed today since this was a telemedicine virtual phone visit  ASSESSMENT & PLAN:    1. Essential hypertension- history of essential hypertension with blood pressure measured by the patient today of 141/65 with a pulse of 64.  She is on carvedilol, olmesartan and hydrochlorothiazide 2. Hyperlipidemia- history of hyperlipidemia on simvastatin followed by her PCP 3. Aortic insufficiency- history of mild to moderate aortic aortic insufficiency with recent 2D echo performed 12/01/2018 revealing an ejection fraction of 60% with mild aortic insufficiency  COVID-19 Education: The signs and symptoms of COVID-19 were discussed with the patient and how to seek care for testing (follow up with PCP or arrange E-visit).  The importance of social distancing was discussed today.  Time:   Today, I have spent 8 minutes with the patient with telehealth technology discussing the above problems.     Medication Adjustments/Labs and Tests Ordered: Current medicines are reviewed at length with the patient today.  Concerns regarding medicines are outlined above.   Tests Ordered: No orders of the defined types were placed in this encounter.   Medication Changes: No orders of the defined types were placed in this encounter.   Disposition:  Follow up in 1 year(s)  Signed, Quay Burow, MD  02/24/2019 7:58 AM    [facility]

## 2019-02-24 NOTE — Patient Instructions (Signed)

## 2019-02-24 NOTE — Telephone Encounter (Signed)
Patient and/or DPR-approved person aware of AVS instructions and verbalized understanding. Letter including After Visit Summary and any other necessary documents to be mailed to the patient's address on file.  

## 2019-03-17 ENCOUNTER — Other Ambulatory Visit: Payer: Self-pay | Admitting: Cardiovascular Disease

## 2019-03-17 DIAGNOSIS — I10 Essential (primary) hypertension: Secondary | ICD-10-CM

## 2019-03-20 ENCOUNTER — Telehealth: Payer: Self-pay | Admitting: *Deleted

## 2019-03-20 NOTE — Telephone Encounter (Signed)
[facility] left a msg on the refill vm requesting a call back at [phone] to discuss patients rx for furosemide. They received a sig of one tablet qod but patient is telling pharmacist that it should be written for one tablet qd. Thanks, MI

## 2019-03-23 ENCOUNTER — Telehealth: Payer: Self-pay | Admitting: Cardiovascular Disease

## 2019-03-23 ENCOUNTER — Other Ambulatory Visit: Payer: Self-pay | Admitting: Cardiovascular Disease

## 2019-03-23 NOTE — Telephone Encounter (Signed)
Spoke with pt, aware her furosemide is 20 mg once daily.

## 2019-03-23 NOTE — Telephone Encounter (Signed)
New Message   Pt c/o medication issue:  1. Name of Medication: furosemide (LASIX) 20 MG tablet   2. How are you currently taking this medication (dosage and times per day)?   3. Are you having a reaction (difficulty breathing--STAT)?   4. What is your medication issue? Patient is calling to obtain instructions on how she should be taking medication

## 2019-05-14 DIAGNOSIS — Z7984 Long term (current) use of oral hypoglycemic drugs: Secondary | ICD-10-CM | POA: Diagnosis not present

## 2019-05-14 DIAGNOSIS — E7800 Pure hypercholesterolemia, unspecified: Secondary | ICD-10-CM | POA: Diagnosis not present

## 2019-05-14 DIAGNOSIS — M7989 Other specified soft tissue disorders: Secondary | ICD-10-CM | POA: Diagnosis not present

## 2019-05-14 DIAGNOSIS — E119 Type 2 diabetes mellitus without complications: Secondary | ICD-10-CM | POA: Diagnosis not present

## 2019-05-14 DIAGNOSIS — I10 Essential (primary) hypertension: Secondary | ICD-10-CM | POA: Diagnosis not present

## 2019-05-22 ENCOUNTER — Other Ambulatory Visit: Payer: Self-pay | Admitting: Anesthesiology

## 2019-05-22 DIAGNOSIS — R5381 Other malaise: Secondary | ICD-10-CM

## 2019-05-29 ENCOUNTER — Other Ambulatory Visit: Payer: Self-pay | Admitting: Anesthesiology

## 2019-05-29 DIAGNOSIS — M7989 Other specified soft tissue disorders: Secondary | ICD-10-CM

## 2019-06-02 ENCOUNTER — Ambulatory Visit
Admission: RE | Admit: 2019-06-02 | Discharge: 2019-06-02 | Disposition: A | Discharge status: Home / Self Care | Payer: Medicare HMO | Source: Ambulatory Visit | Attending: Anesthesiology | Admitting: Anesthesiology

## 2019-06-02 DIAGNOSIS — R1900 Intra-abdominal and pelvic swelling, mass and lump, unspecified site: Secondary | ICD-10-CM | POA: Diagnosis not present

## 2019-06-02 DIAGNOSIS — M7989 Other specified soft tissue disorders: Secondary | ICD-10-CM

## 2019-06-15 DIAGNOSIS — Z23 Encounter for immunization: Secondary | ICD-10-CM | POA: Diagnosis not present

## 2019-08-20 DIAGNOSIS — H04123 Dry eye syndrome of bilateral lacrimal glands: Secondary | ICD-10-CM | POA: Diagnosis not present

## 2019-08-20 DIAGNOSIS — B0239 Other herpes zoster eye disease: Secondary | ICD-10-CM | POA: Diagnosis not present

## 2019-08-20 DIAGNOSIS — H5212 Myopia, left eye: Secondary | ICD-10-CM | POA: Diagnosis not present

## 2019-08-20 DIAGNOSIS — H26492 Other secondary cataract, left eye: Secondary | ICD-10-CM | POA: Diagnosis not present

## 2019-10-20 ENCOUNTER — Ambulatory Visit: Payer: Medicare Other | Attending: Internal Medicine

## 2019-10-20 DIAGNOSIS — Z23 Encounter for immunization: Secondary | ICD-10-CM | POA: Insufficient documentation

## 2019-10-20 NOTE — Progress Notes (Signed)
   Covid-19 Vaccination Clinic  Name:  Amanda Bartlett    MRN: [id number] DOB: 1934/10/12  10/20/2019  [name] was observed post Covid-19 immunization for 15 minutes without incidence. She was provided with Vaccine Information Sheet and instruction to access the V-Safe system.   [name] was instructed to call 911 with any severe reactions post vaccine: Marland Kitchen Difficulty breathing  . Swelling of your face and throat  . A fast heartbeat  . A bad rash all over your body  . Dizziness and weakness    Immunizations Administered    Name Date Dose VIS Date Route   Pfizer COVID-19 Vaccine 10/20/2019 11:52 AM 0.3 mL 09/11/2019 Intramuscular   Manufacturer: [place]   Lot: [id number]   [place]: [id number]

## 2019-11-09 ENCOUNTER — Ambulatory Visit: Payer: Medicare HMO | Attending: Internal Medicine

## 2019-11-09 DIAGNOSIS — Z23 Encounter for immunization: Secondary | ICD-10-CM | POA: Insufficient documentation

## 2019-11-09 NOTE — Progress Notes (Signed)
   Covid-19 Vaccination Clinic  Name:  Amanda Bartlett    MRN: [id number] DOB: [birth year]  11/09/2019  [name] was observed post Covid-19 immunization for 15 minutes without incidence. She was provided with Vaccine Information Sheet and instruction to access the V-Safe system.   [name] was instructed to call 911 with any severe reactions post vaccine: Marland Kitchen Difficulty breathing  . Swelling of your face and throat  . A fast heartbeat  . A bad rash all over your body  . Dizziness and weakness    Immunizations Administered    Name Date Dose VIS Date Route   Pfizer COVID-19 Vaccine 11/09/2019  9:32 AM 0.3 mL 09/11/2019 Intramuscular   Manufacturer: [place]   Lot: [id number]   [place]: [id number]

## 2020-03-01 ENCOUNTER — Encounter: Payer: Self-pay | Admitting: Cardiovascular Disease

## 2020-03-01 ENCOUNTER — Ambulatory Visit (INDEPENDENT_AMBULATORY_CARE_PROVIDER_SITE_OTHER): Payer: Medicare HMO | Admitting: Cardiovascular Disease

## 2020-03-01 ENCOUNTER — Other Ambulatory Visit: Payer: Self-pay

## 2020-03-01 VITALS — BP 140/76 | HR 77 | Ht 60.0 in | Wt 160.4 lb

## 2020-03-01 DIAGNOSIS — I10 Essential (primary) hypertension: Secondary | ICD-10-CM | POA: Diagnosis not present

## 2020-03-01 DIAGNOSIS — I351 Nonrheumatic aortic (valve) insufficiency: Secondary | ICD-10-CM

## 2020-03-01 DIAGNOSIS — E785 Hyperlipidemia, unspecified: Secondary | ICD-10-CM | POA: Diagnosis not present

## 2020-03-01 NOTE — Assessment & Plan Note (Signed)
History of essential hypertension blood pressure measured today at 140/76.  She is on low-dose carvedilol, amlodipine, Benicar and hydrochlorothiazide.

## 2020-03-01 NOTE — Patient Instructions (Signed)
Medication Instructions:  Your physician recommends that you continue on your current medications as directed. Please refer to the Current Medication list given to you today.  *If you need a refill on your cardiac medications before your next appointment, please call your pharmacy*   Lab Work: NONE If you have labs (blood work) drawn today and your tests are completely normal, you will receive your results only by: Marland Kitchen MyChart Message (if you have MyChart) OR . A paper copy in the mail If you have any lab test that is abnormal or we need to change your treatment, we will call you to review the results.   Testing/Procedures: Your physician has requested that you have an echocardiogram. Echocardiography is a painless test that uses sound waves to create images of your heart. It provides your doctor with information about the size and shape of your heart and how well your heart's chambers and valves are working. This procedure takes approximately one hour. There are no restrictions for this procedure. - due in 1 year - done at [address]   Follow-Up: At [facility], you and your health needs are our priority.  As part of our continuing mission to provide you with exceptional heart care, we have created designated Provider Care Teams.  These Care Teams include your primary Cardiologist (physician) and Advanced Practice Providers (APPs -  Physician Assistants and Nurse Practitioners) who all work together to provide you with the care you need, when you need it.  We recommend signing up for the patient portal called "MyChart".  Sign up information is provided on this After Visit Summary.  MyChart is used to connect with patients for Virtual Visits (Telemedicine).  Patients are able to view lab/test results, encounter notes, upcoming appointments, etc.  Non-urgent messages can be sent to your provider as well.   To learn more about what you can do with MyChart, go to  [email or web address].    Your next appointment:   12 month(s)  The format for your next appointment:   In Person  Provider:   You may see Dr. Gwenlyn Found or one of the following Advanced Practice Providers on your designated Care Team:    Kerin Ransom, PA-C  [place], [place]  Coletta Memos, [place]    Other Instructions

## 2020-03-01 NOTE — Assessment & Plan Note (Signed)
History of mild aortic insufficiency by recent 2D echo performed 12/02/2018 with normal LV systolic function.  We will repeat a 2D echo in 1 year.

## 2020-03-01 NOTE — Progress Notes (Signed)
03/01/2020 Amanda Bartlett   August 23, 1935  [id number]  Primary Physician Aurea Graff, PA-C (Inactive) Primary Cardiologist: Lorretta Harp MD [name], [place]  HPI:  Amanda Bartlett is a 85 y.o.  mildly overweight married Caucasian female mother of 9, grandmother of 76 grandchildren who worked at [employer] and [employer] during her working years. She was referred byMark Hepler PA-Cfor cardiovascular evaluation because of fairly new onset dyspnea on exertion and auscultated outflow tract murmur. I last talked to her for a virtual telemedicine phone visit 02/24/2019. Her risk factors include treated hypertension, diabetes and hyperlipidemia. There is a family history of heart disease. She has never had a heart attack or stroke. She's noticed increasing dyspnea on exertion over the last year with with occasional atypical chest pain. Her primary care provider did auscultate a murmur. I obtained a 2-D echocardiogram on 07/09/16 revealing normal LV size and function with mild to moderate aortic insufficiency.A Myoview stress test was normal as well. She was complaining of some edema which resolved with reduction of her amlodipine dose. She does keep a careful log of her blood pressures several times a day and is concerned about the fluctuation in her blood pressure and heart rate.  Since I spoke to her a year ago she continues to do well.  Last echo performed March 2020 showed normal LV size and function with mild AI.  She denies chest pain or shortness of breath.  She does relate quick twinges of chest pain that occur periodically over the last several months that do not sound anginal to me.  She has received her COVID-19 vaccine.   Current Meds  Medication Sig  . aspirin EC 81 MG tablet Take 81 mg by mouth daily.  . carvedilol (COREG) 3.125 MG tablet TAKE 1 TABLET (3.125 MG TOTAL) BY MOUTH 2 (TWO) TIMES DAILY WITH A MEAL.  Marland Kitchen Coenzyme Q10 (CO Q-10) 100 MG CAPS Take 100 mg by mouth daily.  .  furosemide (LASIX) 20 MG tablet Take 1 tablet (20 mg total) by mouth daily.  Marland Kitchen glipiZIDE (GLUCOTROL XL) 10 MG 24 hr tablet Take 10 mg by mouth daily.  . metFORMIN (GLUCOPHAGE) 500 MG tablet Take 500 mg by mouth daily.  . Olmesartan-Amlodipine-HCTZ 40-5-12.5 MG TABS Take 1 tablet by mouth daily.  Glory Rosebush Delica Lancets [id number] MISC   . ONETOUCH VERIO test strip AS DIRECTED 2 TIMES DAILY IN VITRO 90 DAYS  . simvastatin (ZOCOR) 20 MG tablet Take 20 mg by mouth daily.  . TRADJENTA 5 MG TABS tablet Take 5 mg by mouth daily.     Allergies  Allergen Reactions  . Codeine Nausea Only  . Prednisone Swelling    Social History   Socioeconomic History  . Marital status: Married    Spouse name: Not on file  . Number of children: Not on file  . Years of education: Not on file  . Highest education level: Not on file  Occupational History  . Not on file  Tobacco Use  . Smoking status: Never Smoker  . Smokeless tobacco: Never Used  Substance and Sexual Activity  . Alcohol use: Not on file  . Drug use: Not on file  . Sexual activity: Not on file  Other Topics Concern  . Not on file  Social History Narrative  . Not on file   Social Determinants of Health   Financial Resource Strain:   . Difficulty of Paying Living Expenses:   Food Insecurity:   .  Worried About Charity fundraiser in the Last Year:   . Arboriculturist in the Last Year:   Transportation Needs:   . Film/video editor (Medical):   Marland Kitchen Lack of Transportation (Non-Medical):   Physical Activity:   . Days of Exercise per Week:   . Minutes of Exercise per Session:   Stress:   . Feeling of Stress :   Social Connections:   . Frequency of Communication with Friends and Family:   . Frequency of Social Gatherings with Friends and Family:   . Attends Religious Services:   . Active Member of Clubs or Organizations:   . Attends Archivist Meetings:   Marland Kitchen Marital Status:   Intimate Partner Violence:   . Fear of  Current or Ex-Partner:   . Emotionally Abused:   Marland Kitchen Physically Abused:   . Sexually Abused:      Review of Systems: General: negative for chills, fever, night sweats or weight changes.  Cardiovascular: negative for chest pain, dyspnea on exertion, edema, orthopnea, palpitations, paroxysmal nocturnal dyspnea or shortness of breath Dermatological: negative for rash Respiratory: negative for cough or wheezing Urologic: negative for hematuria Abdominal: negative for nausea, vomiting, diarrhea, bright red blood per rectum, melena, or hematemesis Neurologic: negative for visual changes, syncope, or dizziness All other systems reviewed and are otherwise negative except as noted above.    Blood pressure 140/76, pulse 77, height 5' (1.524 m), weight 160 lb 6.4 oz (72.8 kg), SpO2 98 %.  General appearance: alert and no distress Neck: no adenopathy, no carotid bruit, no JVD, supple, symmetrical, trachea midline and thyroid not enlarged, symmetric, no tenderness/mass/nodules Lungs: clear to auscultation bilaterally Heart: regular rate and rhythm, S1, S2 normal, no murmur, click, rub or gallop Extremities: extremities normal, atraumatic, no cyanosis or edema Pulses: 2+ and symmetric Skin: Skin color, texture, turgor normal. No rashes or lesions Neurologic: Alert and oriented X 3, normal strength and tone. Normal symmetric reflexes. Normal coordination and gait  EKG sinus rhythm at 77 with left bundle branch block and occasional PVCs.  I personally reviewed this EKG.  ASSESSMENT AND PLAN:   Essential hypertension History of essential hypertension blood pressure measured today at 140/76.  She is on low-dose carvedilol, amlodipine, Benicar and hydrochlorothiazide.  Dyslipidemia History of dyslipidemia on statin therapy with lipid profile performed 11/16/2019 revealing total cholesterol 113, LDL 43 and HDL 42.  Mild aortic insufficiency History of mild aortic insufficiency by recent 2D echo  performed 12/02/2018 with normal LV systolic function.  We will repeat a 2D echo in 1 year.      Lorretta Harp MD FACP,FACC,FAHA, [facility] 03/01/2020 8:44 AM

## 2020-03-01 NOTE — Assessment & Plan Note (Signed)
History of dyslipidemia on statin therapy with lipid profile performed 11/16/2019 revealing total cholesterol 113, LDL 43 and HDL 42.

## 2020-04-28 ENCOUNTER — Ambulatory Visit: Payer: Medicare HMO | Admitting: Cardiovascular Disease

## 2020-11-01 ENCOUNTER — Ambulatory Visit: Payer: Medicare HMO | Attending: Internal Medicine

## 2020-11-01 ENCOUNTER — Other Ambulatory Visit (HOSPITAL_BASED_OUTPATIENT_CLINIC_OR_DEPARTMENT_OTHER): Payer: Self-pay | Admitting: Internal Medicine

## 2020-11-01 DIAGNOSIS — Z23 Encounter for immunization: Secondary | ICD-10-CM

## 2020-11-01 MED FILL — PFIZER-BIONTECH COVID-19 VA: 30 | 21 days supply | Qty: 0 | Fill #0

## 2020-11-01 NOTE — Progress Notes (Signed)
   Covid-19 Vaccination Clinic  Name:  LANAY ZINDA    MRN: [id number] DOB: Jul 13, 1935  11/01/2020  Ms. Osorno was observed post Covid-19 immunization for 15 minutes without incident. She was provided with Vaccine Information Sheet and instruction to access the V-Safe system. Vaccinated by [employer].  Ms. Robb was instructed to call 911 with any severe reactions post vaccine: Marland Kitchen Difficulty breathing  . Swelling of face and throat  . A fast heartbeat  . A bad rash all over body  . Dizziness and weakness   Immunizations Administered    Name Date Dose VIS Date Route   PFIZER Comrnaty(Gray TOP) Covid-19 Vaccine 11/01/2020  1:16 PM 0.3 mL 09/08/2020 Intramuscular   Manufacturer: [place]   Lot: [id number]   [place]: [phone]

## 2021-02-23 ENCOUNTER — Other Ambulatory Visit: Payer: Self-pay

## 2021-02-23 ENCOUNTER — Ambulatory Visit (HOSPITAL_COMMUNITY): Payer: Medicare HMO | Attending: Cardiology

## 2021-02-23 DIAGNOSIS — I351 Nonrheumatic aortic (valve) insufficiency: Secondary | ICD-10-CM

## 2021-02-23 LAB — ECHOCARDIOGRAM COMPLETE
Area-P 1/2: 3.12 cm2
P 1/2 time: 637 msec
S' Lateral: 3.15 cm

## 2021-02-23 MED ORDER — PERFLUTREN LIPID MICROSPHERE
1.0000 mL | INTRAVENOUS | Status: AC | PRN
  Administered 2021-02-23: 1 mL via INTRAVENOUS

## 2021-02-28 ENCOUNTER — Encounter: Payer: Self-pay | Admitting: Cardiovascular Disease

## 2021-02-28 ENCOUNTER — Other Ambulatory Visit: Payer: Self-pay

## 2021-02-28 ENCOUNTER — Ambulatory Visit (INDEPENDENT_AMBULATORY_CARE_PROVIDER_SITE_OTHER): Payer: Medicare HMO | Admitting: Cardiovascular Disease

## 2021-02-28 VITALS — BP 140/64 | HR 73 | Ht 63.0 in | Wt 159.2 lb

## 2021-02-28 DIAGNOSIS — I10 Essential (primary) hypertension: Secondary | ICD-10-CM

## 2021-02-28 DIAGNOSIS — I351 Nonrheumatic aortic (valve) insufficiency: Secondary | ICD-10-CM

## 2021-02-28 DIAGNOSIS — R0600 Dyspnea, unspecified: Secondary | ICD-10-CM | POA: Diagnosis not present

## 2021-02-28 DIAGNOSIS — E785 Hyperlipidemia, unspecified: Secondary | ICD-10-CM | POA: Diagnosis not present

## 2021-02-28 DIAGNOSIS — I519 Heart disease, unspecified: Secondary | ICD-10-CM

## 2021-02-28 DIAGNOSIS — R0609 Other forms of dyspnea: Secondary | ICD-10-CM

## 2021-02-28 NOTE — Assessment & Plan Note (Signed)
History of dyslipidemia on statin therapy with lipid profile performed 05/24/2020 revealing total cholesterol 140, LDL of 64 and HDL of 50.

## 2021-02-28 NOTE — Assessment & Plan Note (Signed)
History of dyspnea on exertion which has remained stable.

## 2021-02-28 NOTE — Progress Notes (Signed)
02/28/2021 Amanda Bartlett   [birth year]  [id number]  Primary Physician Aurea Graff, PA-C (Inactive) Primary Cardiologist: Lorretta Harp MD [name], [place]  HPI:  Amanda Bartlett is a 86 y.o.  mildly overweight married Caucasian female mother of 65, grandmother of 72 grandchildren who worked at [employer] and [employer] during her working years. She was referred byMark Hepler PA-Cfor cardiovascular evaluation because of fairly new onset dyspnea on exertion and auscultated outflow tract murmur. I last  saw her in the office 03/01/2020. Her risk factors include treated hypertension, diabetes and hyperlipidemia. There is a family history of heart disease. She has never had a heart attack or stroke. She's noticed increasing dyspnea on exertion over the last year with with occasional atypical chest pain. Her primary care provider did auscultate a murmur. I obtained a 2-D echocardiogram on 07/09/16 revealing normal LV size and function with mild to moderate aortic insufficiency.A Myoview stress test was normal as well. She was complaining of some edema which resolved with reduction of her amlodipine dose. She does keep a careful log of her blood pressures several times a day and is concerned about the fluctuation in her blood pressure and heart rate.  Since I  saw her in the office a year ago she continues to do well.  She does have mild chronic dyspnea and mild lower extremity edema.  She has occasional atypical chest pain although she has had a negative Myoview stress test 07/31/2016.  2D echo performed 02/23/2021 revealed a decline in her EF from normal back on 12/01/2018 down to 35 to 40% recently with mild to moderate MR and mild AI.   Current Meds  Medication Sig  . Coenzyme Q10 (CO Q-10) 100 MG CAPS Take 100 mg by mouth daily.  . furosemide (LASIX) 20 MG tablet Take 1 tablet (20 mg total) by mouth daily.  Marland Kitchen glipiZIDE (GLUCOTROL XL) 10 MG 24 hr tablet Take 10 mg by mouth daily.  Marland Kitchen glipiZIDE  (GLUCOTROL XL) 5 MG 24 hr tablet Take 5 mg by mouth at bedtime.  . Olmesartan-Amlodipine-HCTZ 40-5-12.5 MG TABS Take 1 tablet by mouth daily.  Glory Rosebush Delica Lancets 40J MISC   . ONETOUCH VERIO test strip AS DIRECTED 2 TIMES DAILY IN VITRO 90 DAYS  . simvastatin (ZOCOR) 20 MG tablet Take 20 mg by mouth daily.  . TRADJENTA 5 MG TABS tablet Take 5 mg by mouth daily.  . [DISCONTINUED] carvedilol (COREG) 3.125 MG tablet TAKE 1 TABLET (3.125 MG TOTAL) BY MOUTH 2 (TWO) TIMES DAILY WITH A MEAL.     Allergies  Allergen Reactions  . Codeine Nausea Only  . Prednisone Swelling    Social History   Socioeconomic History  . Marital status: Married    Spouse name: Not on file  . Number of children: Not on file  . Years of education: Not on file  . Highest education level: Not on file  Occupational History  . Not on file  Tobacco Use  . Smoking status: Never Smoker  . Smokeless tobacco: Never Used  Substance and Sexual Activity  . Alcohol use: Not on file  . Drug use: Not on file  . Sexual activity: Not on file  Other Topics Concern  . Not on file  Social History Narrative  . Not on file   Social Determinants of Health   Financial Resource Strain: Not on file  Food Insecurity: Not on file  Transportation Needs: Not on file  Physical  Activity: Not on file  Stress: Not on file  Social Connections: Not on file  Intimate Partner Violence: Not on file     Review of Systems: General: negative for chills, fever, night sweats or weight changes.  Cardiovascular: negative for chest pain, dyspnea on exertion, edema, orthopnea, palpitations, paroxysmal nocturnal dyspnea or shortness of breath Dermatological: negative for rash Respiratory: negative for cough or wheezing Urologic: negative for hematuria Abdominal: negative for nausea, vomiting, diarrhea, bright red blood per rectum, melena, or hematemesis Neurologic: negative for visual changes, syncope, or dizziness All other systems  reviewed and are otherwise negative except as noted above.    Blood pressure 140/64, pulse 73, height 5\' 3"  (1.6 m), weight 159 lb 3.2 oz (72.2 kg).  General appearance: alert and no distress Neck: no adenopathy, no carotid bruit, no JVD, supple, symmetrical, trachea midline and thyroid not enlarged, symmetric, no tenderness/mass/nodules Lungs: clear to auscultation bilaterally Heart: regular rate and rhythm, S1, S2 normal, no murmur, click, rub or gallop Extremities: extremities normal, atraumatic, no cyanosis or edema Pulses: 2+ and symmetric Skin: Skin color, texture, turgor normal. No rashes or lesions Neurologic: Alert and oriented X 3, normal strength and tone. Normal symmetric reflexes. Normal coordination and gait  EKG sinus rhythm at 73 with left bundle branch block.  I personally reviewed this EKG.  ASSESSMENT AND PLAN:   Essential hypertension History of essential hypertension a blood pressure measured today at 140/64.  She is on amlodipine, olmesartan and hydrochlorothiazide.  Dyslipidemia History of dyslipidemia on statin therapy with lipid profile performed 05/24/2020 revealing total cholesterol 140, LDL of 64 and HDL of 50.  Dyspnea on exertion History of dyspnea on exertion which has remained stable.  Mild aortic insufficiency History of mild aortic insufficiency by 2D echo most recently 02/23/2021.  There was also mild to moderate MR noted.  LV dysfunction Newly diagnosed LV dysfunction by 2D echo performed 02/23/2021 with a EF of 35 to 40%.  EF 12/01/2018 revealed normal EF.  Her symptoms have not changed.  She does have chronic left bundle branch block.  She had mild to moderate MR and mild AI.  We will recheck a 2D echo in 6 months.      Lorretta Harp MD FACP,FACC,FAHA, [facility] 02/28/2021 9:41 AM

## 2021-02-28 NOTE — Assessment & Plan Note (Signed)
History of mild aortic insufficiency by 2D echo most recently 02/23/2021.  There was also mild to moderate MR noted.

## 2021-02-28 NOTE — Assessment & Plan Note (Signed)
History of essential hypertension a blood pressure measured today at 140/64.  She is on amlodipine, olmesartan and hydrochlorothiazide.

## 2021-02-28 NOTE — Patient Instructions (Signed)
Medication Instructions:  Your physician recommends that you continue on your current medications as directed. Please refer to the Current Medication list given to you today.  *If you need a refill on your cardiac medications before your next appointment, please call your pharmacy*   Testing/Procedures: Your physician has requested that you have an echocardiogram. Echocardiography is a painless test that uses sound waves to create images of your heart. It provides your doctor with information about the size and shape of your heart and how well your heart's chambers and valves are working. This procedure takes approximately one hour. There are no restrictions for this procedure. This procedure is done at [address]. To be done Nov/Dec 2022.   Follow-Up: At [facility], you and your health needs are our priority.  As part of our continuing mission to provide you with exceptional heart care, we have created designated Provider Care Teams.  These Care Teams include your primary Cardiologist (physician) and Advanced Practice Providers (APPs -  Physician Assistants and Nurse Practitioners) who all work together to provide you with the care you need, when you need it.  We recommend signing up for the patient portal called "MyChart".  Sign up information is provided on this After Visit Summary.  MyChart is used to connect with patients for Virtual Visits (Telemedicine).  Patients are able to view lab/test results, encounter notes, upcoming appointments, etc.  Non-urgent messages can be sent to your provider as well.   To learn more about what you can do with MyChart, go to [email or web address].    Your next appointment:   6 month(s)  The format for your next appointment:   In Person  Provider:   Quay Burow, MD

## 2021-02-28 NOTE — Assessment & Plan Note (Signed)
Newly diagnosed LV dysfunction by 2D echo performed 02/23/2021 with a EF of 35 to 40%.  EF 12/01/2018 revealed normal EF.  Her symptoms have not changed.  She does have chronic left bundle branch block.  She had mild to moderate MR and mild AI.  We will recheck a 2D echo in 6 months.

## 2021-09-04 ENCOUNTER — Other Ambulatory Visit: Payer: Self-pay

## 2021-09-04 ENCOUNTER — Ambulatory Visit (HOSPITAL_COMMUNITY): Payer: Medicare HMO | Attending: Cardiovascular Disease

## 2021-09-04 DIAGNOSIS — I351 Nonrheumatic aortic (valve) insufficiency: Secondary | ICD-10-CM | POA: Diagnosis present

## 2021-09-04 LAB — ECHOCARDIOGRAM COMPLETE
Area-P 1/2: 3.68 cm2
MV M vel: 4.81 m/s
MV Peak grad: 92.5 mmHg
P 1/2 time: 611 msec
Radius: 0.47 cm
S' Lateral: 3.35 cm

## 2021-09-13 ENCOUNTER — Other Ambulatory Visit: Payer: Self-pay

## 2021-09-13 ENCOUNTER — Encounter: Payer: Self-pay | Admitting: *Deleted

## 2021-09-13 ENCOUNTER — Encounter: Payer: Self-pay | Admitting: Cardiovascular Disease

## 2021-09-13 ENCOUNTER — Ambulatory Visit (INDEPENDENT_AMBULATORY_CARE_PROVIDER_SITE_OTHER): Payer: Medicare HMO | Admitting: Cardiovascular Disease

## 2021-09-13 VITALS — BP 126/77 | HR 69 | Ht 64.0 in | Wt 155.8 lb

## 2021-09-13 DIAGNOSIS — R001 Bradycardia, unspecified: Secondary | ICD-10-CM

## 2021-09-13 DIAGNOSIS — I351 Nonrheumatic aortic (valve) insufficiency: Secondary | ICD-10-CM

## 2021-09-13 DIAGNOSIS — I10 Essential (primary) hypertension: Secondary | ICD-10-CM | POA: Diagnosis not present

## 2021-09-13 DIAGNOSIS — E785 Hyperlipidemia, unspecified: Secondary | ICD-10-CM

## 2021-09-13 DIAGNOSIS — I519 Heart disease, unspecified: Secondary | ICD-10-CM

## 2021-09-13 DIAGNOSIS — R600 Localized edema: Secondary | ICD-10-CM

## 2021-09-13 NOTE — Assessment & Plan Note (Signed)
History of mild aortic insufficiency seen on recent 2D echo as well dated 09/04/2021.  Her EF was stable in the 40 to 45% range with mild MR.

## 2021-09-13 NOTE — Patient Instructions (Signed)
Medication Instructions:  Your physician recommends that you continue on your current medications as directed. Please refer to the Current Medication list given to you today.  *If you need a refill on your cardiac medications before your next appointment, please call your pharmacy*   Testing/Procedures: [employer] Cardiac Event Monitor Instructions Your physician has requested you wear your cardiac event monitor for 30 days. [employer] may call or text to confirm a shipping address. The monitor will be sent to a land address via UPS. [employer] will not ship a monitor to a PO BOX. It typically takes 3-5 days to receive your monitor after it has been enrolled. [employer] will assist with USPS tracking if your package is delayed. The telephone number for [employer] is [phone]. Once you have received your monitor, please review the enclosed instructions. Instruction tutorials can also be viewed under help and settings on the enclosed cell phone. Your monitor has already been registered assigning a specific monitor serial # to you.  Applying the monitor Remove cell phone from case and turn it on. The cell phone works as Dealer and needs to be within [employer] of you at all times. The cell phone will need to be charged on a daily basis. We recommend you plug the cell phone into the enclosed charger at your bedside table every night.  Monitor batteries: You will receive two monitor batteries labelled #1 and #2. These are your recorders. Plug battery #2 onto the second connection on the enclosed charger. Keep one battery on the charger at all times. This will keep the monitor battery deactivated. It will also keep it fully charged for when you need to switch your monitor batteries. A small light will be blinking on the battery emblem when it is charging. The light on the battery emblem will remain on when the battery is fully charged.  Open package of a Monitor strip. Insert  battery #1 into black hood on strip and gently squeeze monitor battery onto connection as indicated in instruction booklet. Set aside while preparing skin.  Choose location for your strip, vertical or horizontal, as indicated in the instruction booklet. Shave to remove all hair from location. There cannot be any lotions, oils, powders, or colognes on skin where monitor is to be applied. Wipe skin clean with enclosed Saline wipe. Dry skin completely.  Peel paper labeled #1 off the back of the Monitor strip exposing the adhesive. Place the monitor on the chest in the vertical or horizontal position shown in the instruction booklet. One arrow on the monitor strip must be pointing upward. Carefully remove paper labeled #2, attaching remainder of strip to your skin. Try not to create any folds or wrinkles in the strip as you apply it.  Firmly press and release the circle in the center of the monitor battery. You will hear a small beep. This is turning the monitor battery on. The heart emblem on the monitor battery will light up every 5 seconds if the monitor battery in turned on and connected to the patient securely. Do not push and hold the circle down as this turns the monitor battery off. The cell phone will locate the monitor battery. A screen will appear on the cell phone checking the connection of your monitor strip. This may read poor connection initially but change to good connection within the next minute. Once your monitor accepts the connection you will hear a series of 3 beeps followed by a climbing crescendo of beeps. A screen will appear  on the cell phone showing the two monitor strip placement options. Touch the picture that demonstrates where you applied the monitor strip.  Your monitor strip and battery are waterproof. You are able to shower, bathe, or swim with the monitor on. They just ask you do not submerge deeper than 3 feet underwater. We recommend removing the monitor if you  are swimming in a lake, river, or ocean.  Your monitor battery will need to be switched to a fully charged monitor battery approximately once a week. The cell phone will alert you of an action which needs to be made.  On the cell phone, tap for details to reveal connection status, monitor battery status, and cell phone battery status. The green dots indicates your monitor is in good status. A red dot indicates there is something that needs your attention.  To record a symptom, click the circle on the monitor battery. In 30-60 seconds a list of symptoms will appear on the cell phone. Select your symptom and tap save. Your monitor will record a sustained or significant arrhythmia regardless of you clicking the button. Some patients do not feel the heart rhythm irregularities. [employer] will notify us of any serious or critical events.  Refer to instruction booklet for instructions on switching batteries, changing strips, the Do not disturb or Pause features, or any additional questions.  Call [employer] at [phone], to confirm your monitor is transmitting and record your baseline. They will answer any questions you may have regarding the monitor instructions at that time.  Returning the monitor to [place] all equipment back into blue box. Peel off strip of paper to expose adhesive and close box securely. There is a prepaid UPS shipping label on this box. Drop in a UPS drop box, or at a UPS facility like [employer]. You may also contact [employer] to arrange UPS to pick up monitor package at your home.  Follow-Up: At [facility], you and your health needs are our priority.  As part of our continuing mission to provide you with exceptional heart care, we have created designated Provider Care Teams.  These Care Teams include your primary Cardiologist (physician) and Advanced Practice Providers (APPs -  Physician Assistants and Nurse Practitioners) who all work together to provide  you with the care you need, when you need it.  We recommend signing up for the patient portal called "MyChart".  Sign up information is provided on this After Visit Summary.  MyChart is used to connect with patients for Virtual Visits (Telemedicine).  Patients are able to view lab/test results, encounter notes, upcoming appointments, etc.  Non-urgent messages can be sent to your provider as well.   To learn more about what you can do with MyChart, go to [email or web address].    Your next appointment:   6 month(s)  The format for your next appointment:   In Person  Provider:   Coletta Memos, FNP, Fabian Sharp, PA-C, Sande Rives, PA-C, Caron Presume, PA-C, Jory Sims, DNP, ANP, or Almyra Deforest, PA-C      Then, Quay Burow, MD will plan to see you again in 12 month(s).

## 2021-09-13 NOTE — Assessment & Plan Note (Signed)
Patient describes episodes of heart rates in the 30s and 40s that are prolonged and associated with weakness and dizziness.  I am going to get a 30-day event monitor to further evaluate

## 2021-09-13 NOTE — Progress Notes (Signed)
Patient ID: Amanda Bartlett, female   DOB: 09-20-1935, 86 y.o.   MRN: [id number] Patient enrolled for [employer] to ship a 30 day cardiac event monitor to her address on file.

## 2021-09-13 NOTE — Assessment & Plan Note (Signed)
History lower extremity edema adequately treated on furosemide

## 2021-09-13 NOTE — Assessment & Plan Note (Signed)
History of dyslipidemia on statin therapy with lipid profile performed 05/25/2021 revealing total cholesterol 148, LDL 65 and HDL 50.

## 2021-09-13 NOTE — Assessment & Plan Note (Signed)
History of LV dysfunction with an EF of 40 to 45% by 2D echo performed 09/04/2021 thought to be nonischemic in nature.  Her last echo performed 02/23/2021 revealed an EF of 35% with mild to moderate AI.  She is on an ARB.  She is not on a beta-blocker because of transient episodes of bradycardia.

## 2021-09-13 NOTE — Assessment & Plan Note (Signed)
History of essential hypertension a blood pressure measured today at 126/77.  She is on olmesartan and amlodipine as well as hydrochlorothiazide.

## 2021-09-13 NOTE — Progress Notes (Signed)
09/13/2021 Amanda Bartlett   June 16, 1935  [id number]  Primary Physician Carolee Rota, NP Primary Cardiologist: Lorretta Harp MD Amanda Bartlett, [place]  HPI:  Amanda Bartlett is a 86 y.o.   mildly overweight married Caucasian female mother of 52, grandmother of 25 grandchildren who worked at [employer] and [employer] during her working years. She was referred by Corine Shelter PA-C for cardiovascular evaluation because of fairly new onset dyspnea on exertion and auscultated outflow tract murmur. I last  saw her in the office 02/28/2021.  Her husband Amanda Bartlett is also a patient of mine.  Her risk factors include treated hypertension, diabetes and hyperlipidemia. There is a family history of heart disease. She has never had a heart attack or stroke. She's noticed increasing dyspnea on exertion over the last year with with occasional atypical chest pain. Her primary care provider did auscultate a murmur. I obtained a 2-D echocardiogram on 07/09/16 revealing normal LV size and function with mild to moderate aortic insufficiency. A Myoview stress test was normal as well. She was complaining of some edema which resolved with reduction of her amlodipine dose. She does keep a careful log of her blood pressures several times a day and is concerned about the fluctuation in her blood pressure and heart rate.    She does have mild chronic dyspnea and mild lower extremity edema.  She has occasional atypical chest pain although she has had a negative Myoview stress test 07/31/2016.  2D echo performed 02/23/2021 revealed a decline in her EF from normal back on 12/01/2018 down to 35 to 40% recently with mild to moderate MR and mild AI.  Since I saw her 6 months ago she continues to do well.  She does have some mild dyspnea walking to the mailbox but this is not changed.  She denies chest pain.  Recent 2D echo performed 09/04/2021 revealed a slight increase in her EF from 35 to 40% up to 40 to 45%.  She had mild MR and mild AI.   No  outpatient medications have been marked as taking for the 09/13/21 encounter (Office Visit) with Lorretta Harp, MD.     Allergies  Allergen Reactions   Codeine Nausea Only   Prednisone Swelling    Social History   Socioeconomic History   Marital status: Married    Spouse name: Not on file   Number of children: Not on file   Years of education: Not on file   Highest education level: Not on file  Occupational History   Not on file  Tobacco Use   Smoking status: Never   Smokeless tobacco: Never  Substance and Sexual Activity   Alcohol use: Not on file   Drug use: Not on file   Sexual activity: Not on file  Other Topics Concern   Not on file  Social History Narrative   Not on file   Social Determinants of Health   Financial Resource Strain: Not on file  Food Insecurity: Not on file  Transportation Needs: Not on file  Physical Activity: Not on file  Stress: Not on file  Social Connections: Not on file  Intimate Partner Violence: Not on file     Review of Systems: General: negative for chills, fever, night sweats or weight changes.  Cardiovascular: negative for chest pain, dyspnea on exertion, edema, orthopnea, palpitations, paroxysmal nocturnal dyspnea or shortness of breath Dermatological: negative for rash Respiratory: negative for cough or wheezing Urologic: negative for hematuria Abdominal: negative  for nausea, vomiting, diarrhea, bright red blood per rectum, melena, or hematemesis Neurologic: negative for visual changes, syncope, or dizziness All other systems reviewed and are otherwise negative except as noted above.    Blood pressure 126/77, pulse 69, height 5\' 4"  (1.626 m), weight 155 lb 12.8 oz (70.7 kg), SpO2 95 %.  General appearance: alert and no distress Neck: no adenopathy, no carotid bruit, no JVD, supple, symmetrical, trachea midline, and thyroid not enlarged, symmetric, no tenderness/mass/nodules Lungs: clear to auscultation bilaterally Heart:  regular rate and rhythm, S1, S2 normal, no murmur, click, rub or gallop Extremities: extremities normal, atraumatic, no cyanosis or edema Pulses: 2+ and symmetric Skin: Skin color, texture, turgor normal. No rashes or lesions Neurologic: Grossly normal  EKG sinus rhythm at 69 with left bundle branch block.  I personally reviewed this EKG.  ASSESSMENT AND PLAN:   Essential hypertension History of essential hypertension a blood pressure measured today at 126/77.  She is on olmesartan and amlodipine as well as hydrochlorothiazide.  Dyslipidemia History of dyslipidemia on statin therapy with lipid profile performed 05/25/2021 revealing total cholesterol 148, LDL 65 and HDL 50.  Mild aortic insufficiency History of mild aortic insufficiency seen on recent 2D echo as well dated 09/04/2021.  Her EF was stable in the 40 to 45% range with mild MR.  Edema of both legs History lower extremity edema adequately treated on furosemide  LV dysfunction History of LV dysfunction with an EF of 40 to 45% by 2D echo performed 09/04/2021 thought to be nonischemic in nature.  Her last echo performed 02/23/2021 revealed an EF of 35% with mild to moderate AI.  She is on an ARB.  She is not on a beta-blocker because of transient episodes of bradycardia.  Slow heart rate Patient describes episodes of heart rates in the 30s and 40s that are prolonged and associated with weakness and dizziness.  I am going to get a 30-day event monitor to further evaluate     Lorretta Harp MD [facility], [facility] 09/13/2021 9:14 AM

## 2021-09-23 ENCOUNTER — Ambulatory Visit (INDEPENDENT_AMBULATORY_CARE_PROVIDER_SITE_OTHER): Payer: Medicare HMO

## 2021-09-23 DIAGNOSIS — R001 Bradycardia, unspecified: Secondary | ICD-10-CM | POA: Diagnosis not present

## 2021-09-23 DIAGNOSIS — R42 Dizziness and giddiness: Secondary | ICD-10-CM

## 2021-10-24 ENCOUNTER — Other Ambulatory Visit: Payer: Self-pay | Admitting: Cardiovascular Disease

## 2021-10-24 DIAGNOSIS — R42 Dizziness and giddiness: Secondary | ICD-10-CM

## 2021-10-24 DIAGNOSIS — R001 Bradycardia, unspecified: Secondary | ICD-10-CM

## 2022-02-28 NOTE — Progress Notes (Addendum)
Cardiology Office Note:    Date:  03/01/2022   ID:  Amanda Bartlett, DOB 05-16-1935, MRN [id number]  PCP:  Carolee Rota, NP [place] Cardiologist: Quay Burow, MD  Reason for visit: 34-monthfollow-up  History of Present Illness:    Amanda LEWINGis a 87y.o. female with a hx of hypertension, hyperlipidemia, diabetes, family history of heart disease, aortic insufficiency, mitral regurgitation, lower extremity edema, systolic heart failure.  She last saw Dr. BAlvester Chouin December 2022.  She was doing well with stable mild dyspnea walking to the mailbox.  No chest pain.  2D echo performed 09/04/2021 revealed a slight increase in her EF from 35 to 40% up to 40 to 45%.  She had mild MR and mild AI.  Patient reported low heart rates in 30s and 40s with associated weakness and dizziness.  Heart monitor ordered -showed normal sinus rhythm, lowest heart rate 50, average heart rate 69, frequent PVCs with PVC burden 16%.  Today, she states she is doing ok.  She states sometimes when her heart rate gets in the 30s, her vision gets blurry.  She will take a rest.  She denies syncopal episodes.  She rarely has palpitations but had an episode last month.  She has stable dyspnea on moderate exertion.  She denies PND and orthopnea.  She has had ankle edema x1 month.      Past medical history: hypertension, hyperlipidemia, diabetes, family history of heart disease, aortic insufficiency, mitral regurgitation, lower extremity edema, systolic heart failure  Current Medications: Current Meds  Medication Sig   carvedilol (COREG) 3.125 MG tablet Take 3.125 mg by mouth 2 (two) times daily.   Coenzyme Q10 (CO Q-10) 100 MG CAPS Take 100 mg by mouth daily.   furosemide (LASIX) 20 MG tablet Take 1 tablet (20 mg total) by mouth daily.   glipiZIDE (GLUCOTROL XL) 10 MG 24 hr tablet Take 10 mg by mouth daily.   glipiZIDE (GLUCOTROL XL) 5 MG 24 hr tablet Take 5 mg by mouth at bedtime.   Olmesartan-Amlodipine-HCTZ  40-5-12.5 MG TABS Take 1 tablet by mouth daily.   OneTouch Delica Lancets 306YMISC    ONETOUCH VERIO test strip AS DIRECTED 2 TIMES DAILY IN VITRO 90 DAYS   simvastatin (ZOCOR) 20 MG tablet Take 20 mg by mouth daily.   TRADJENTA 5 MG TABS tablet Take 5 mg by mouth daily.     Allergies:   Codeine and Prednisone   Social History   Socioeconomic History   Marital status: Married    Spouse name: Not on file   Number of children: Not on file   Years of education: Not on file   Highest education level: Not on file  Occupational History   Not on file  Tobacco Use   Smoking status: Never   Smokeless tobacco: Never  Substance and Sexual Activity   Alcohol use: Not on file   Drug use: Not on file   Sexual activity: Not on file  Other Topics Concern   Not on file  Social History Narrative   Not on file   Social Determinants of Health   Financial Resource Strain: Not on file  Food Insecurity: Not on file  Transportation Needs: Not on file  Physical Activity: Not on file  Stress: Not on file  Social Connections: Not on file     Family History: The patient's family history is not on file.  ROS:   Please see the history of present  illness.     EKGs/Labs/Other Studies Reviewed:    EKG:  The ekg ordered today demonstrates sinus bradycardia with occasional PVCs, left bundle branch block, heart rate 58.  Recent Labs: No results found for requested labs within last 8760 hours.   Recent Lipid Panel No results found for: CHOL, TRIG, HDL, LDLCALC, LDLDIRECT  Physical Exam:    VS:  BP 120/60 (BP Location: Left Arm)   Pulse (!) 58   Ht '5\' 4"'$  (1.626 m)   Wt 160 lb 12.8 oz (72.9 kg)   SpO2 97%   BMI 27.60 kg/m    No data found.  Wt Readings from Last 3 Encounters:  03/01/22 160 lb 12.8 oz (72.9 kg)  09/13/21 155 lb 12.8 oz (70.7 kg)  02/28/21 159 lb 3.2 oz (72.2 kg)     GEN:  Well nourished, well developed in no acute distress HEENT: Normal NECK: No JVD; No carotid  bruits CARDIAC: RRR with ectopics RESPIRATORY:  Clear to auscultation without rales, wheezing or rhonchi  ABDOMEN: Soft, non-tender, non-distended MUSCULOSKELETAL: 1+ pedal edema bilaterally SKIN: Warm and dry NEUROLOGIC:  Alert and oriented PSYCHIATRIC:  Normal affect    ASSESSMENT AND PLAN   Bradycardia PVCs -Mentions episodes of heart rates in the 30s with blurry vision. -30-day heart monitor showed normal sinus rhythm, lowest heart rate 50, average heart rate 69, frequent PVCs with PVC burden 17%. -Currently on carvedilol 3.125 mg twice daily. -I will review monitor results with EP.  Difficult to treat PVCs with further AV nodal blocking agents given episodic bradycardia.   --> Reviewed with EP - pt with outflow tract PVCs.  Options to continue current tx vs. stopping coreg & starting amiodarone '200mg'$  daily with recheck EKG in 6-8 weeks (would need to discuss risk of worsening conduction disease, I.e. complete heart block).  I discussed options with patient.  She will continue current treatment for now (Coreg); pt without syncope.  Chronic systolic and diastolic heart failure, minimally hypervolemic -Echo December 2022 with a EF of 40 to 74%, grade 1 diastolic dysfunction -On Lasix 20 mg once daily.  Advised to take an extra Lasix for edema or weight gain. -Recommend salt restriction, leg elevation and ambulation as tolerated.  Aortic insufficiency Mitral regurgitation -2D echo December 2022 with mild AI, mild MR  Hypertension, well controlled -She is on olmesartan/amlodipine/HCTZ 40-5-12.5 mg daily and carvedilol 3.125 mg twice daily.  I wonder if this is too much blood pressure medications for her given her age with her episodic blurry vision.  Hyperlipidemia -LDL of 70 in February 2023.  Continue simvastatin 20 mg daily.  Disposition - Follow-up in 6 months.   Medication Adjustments/Labs and Tests Ordered: Current medicines are reviewed at length with the patient today.   Concerns regarding medicines are outlined above.  Orders Placed This Encounter  Procedures   EKG 12-Lead   No orders of the defined types were placed in this encounter.   Patient Instructions  Medication Instructions:  No Changes *If you need a refill on your cardiac medications before your next appointment, please call your pharmacy*   Lab Work: No Labs If you have labs (blood work) drawn today and your tests are completely normal, you will receive your results only by: [place] (if you have MyChart) OR A paper copy in the mail If you have any lab test that is abnormal or we need to change your treatment, we will call you to review the results.   Testing/Procedures: No Testing  Follow-Up: At [facility], you and your health needs are our priority.  As part of our continuing mission to provide you with exceptional heart care, we have created designated Provider Care Teams.  These Care Teams include your primary Cardiologist (physician) and Advanced Practice Providers (APPs -  Physician Assistants and Nurse Practitioners) who all work together to provide you with the care you need, when you need it.  We recommend signing up for the patient portal called "MyChart".  Sign up information is provided on this After Visit Summary.  MyChart is used to connect with patients for Virtual Visits (Telemedicine).  Patients are able to view lab/test results, encounter notes, upcoming appointments, etc.  Non-urgent messages can be sent to your provider as well.   To learn more about what you can do with MyChart, go to [email or web address].    Your next appointment:   6 month(s)  The format for your next appointment:   In Person  Provider:   Caron Presume, PA-C   or Quay Burow, MD   :1}    Other Instructions Recommended to take additional Lasix for weight gain of 3 pounds over night and 5 pounds in a week. Check weight daily.  Important Information About  Sugar         Signed, Gaston Islam  03/01/2022 12:34 PM     [facility]

## 2022-03-01 ENCOUNTER — Ambulatory Visit: Payer: Medicare HMO | Admitting: General Practice

## 2022-03-01 ENCOUNTER — Encounter: Payer: Self-pay | Admitting: Physician Assistant

## 2022-03-01 ENCOUNTER — Ambulatory Visit: Payer: Medicare HMO | Admitting: Physician Assistant

## 2022-03-01 ENCOUNTER — Ambulatory Visit (INDEPENDENT_AMBULATORY_CARE_PROVIDER_SITE_OTHER): Payer: Medicare HMO | Admitting: Physician Assistant

## 2022-03-01 VITALS — BP 120/60 | HR 58 | Ht 64.0 in | Wt 160.8 lb

## 2022-03-01 DIAGNOSIS — I351 Nonrheumatic aortic (valve) insufficiency: Secondary | ICD-10-CM

## 2022-03-01 DIAGNOSIS — I5042 Chronic combined systolic (congestive) and diastolic (congestive) heart failure: Secondary | ICD-10-CM | POA: Diagnosis not present

## 2022-03-01 DIAGNOSIS — I493 Ventricular premature depolarization: Secondary | ICD-10-CM

## 2022-03-01 NOTE — Patient Instructions (Signed)
Medication Instructions:  No Changes *If you need a refill on your cardiac medications before your next appointment, please call your pharmacy*   Lab Work: No Labs If you have labs (blood work) drawn today and your tests are completely normal, you will receive your results only by: [place] (if you have MyChart) OR A paper copy in the mail If you have any lab test that is abnormal or we need to change your treatment, we will call you to review the results.   Testing/Procedures: No Testing   Follow-Up: At [facility], you and your health needs are our priority.  As part of our continuing mission to provide you with exceptional heart care, we have created designated Provider Care Teams.  These Care Teams include your primary Cardiologist (physician) and Advanced Practice Providers (APPs -  Physician Assistants and Nurse Practitioners) who all work together to provide you with the care you need, when you need it.  We recommend signing up for the patient portal called "MyChart".  Sign up information is provided on this After Visit Summary.  MyChart is used to connect with patients for Virtual Visits (Telemedicine).  Patients are able to view lab/test results, encounter notes, upcoming appointments, etc.  Non-urgent messages can be sent to your provider as well.   To learn more about what you can do with MyChart, go to [email or web address].    Your next appointment:   6 month(s)  The format for your next appointment:   In Person  Provider:   Caron Presume, PA-C   or Quay Burow, MD   :1}    Other Instructions Recommended to take additional Lasix for weight gain of 3 pounds over night and 5 pounds in a week. Check weight daily.  Important Information About Sugar

## 2022-09-04 ENCOUNTER — Encounter: Payer: Self-pay | Admitting: Cardiovascular Disease

## 2022-09-04 ENCOUNTER — Ambulatory Visit: Payer: Medicare HMO | Attending: Cardiovascular Disease | Admitting: Cardiovascular Disease

## 2022-09-04 VITALS — BP 152/64 | HR 60 | Ht 66.0 in | Wt 161.0 lb

## 2022-09-04 DIAGNOSIS — I10 Essential (primary) hypertension: Secondary | ICD-10-CM

## 2022-09-04 DIAGNOSIS — R0609 Other forms of dyspnea: Secondary | ICD-10-CM | POA: Diagnosis not present

## 2022-09-04 DIAGNOSIS — E785 Hyperlipidemia, unspecified: Secondary | ICD-10-CM

## 2022-09-04 DIAGNOSIS — I351 Nonrheumatic aortic (valve) insufficiency: Secondary | ICD-10-CM

## 2022-09-04 DIAGNOSIS — R001 Bradycardia, unspecified: Secondary | ICD-10-CM

## 2022-09-04 NOTE — Progress Notes (Signed)
09/04/2022 Amanda Bartlett   [birth year]  [id number]  Primary Physician Carolee Rota, NP Primary Cardiologist: Lorretta Harp MD [name], [place]  HPI:  Amanda Bartlett is a 87 y.o.  mildly overweight married Caucasian female mother of 52, grandmother of 8 grandchildren who worked at [employer] and [employer] during her working years. She was referred by Corine Shelter PA-C for cardiovascular evaluation because of fairly new onset dyspnea on exertion and auscultated outflow tract murmur. I last  saw her in the office 09/13/2021.  Her husband Marchia Bond is also a patient of mine.  Her risk factors include treated hypertension, diabetes and hyperlipidemia. There is a family history of heart disease. She has never had a heart attack or stroke. She's noticed increasing dyspnea on exertion over the last year with with occasional atypical chest pain. Her primary care provider did auscultate a murmur. I obtained a 2-D echocardiogram on 07/09/16 revealing normal LV size and function with mild to moderate aortic insufficiency. A Myoview stress test was normal as well. She was complaining of some edema which resolved with reduction of her amlodipine dose. She does keep a careful log of her blood pressures several times a day and is concerned about the fluctuation in her blood pressure and heart rate.    She does have mild chronic dyspnea and mild lower extremity edema.  She has occasional atypical chest pain although she has had a negative Myoview stress test 07/31/2016.  2D echo performed 02/23/2021 revealed a decline in her EF from normal back on 12/01/2018 down to 35 to 40% recently with mild to moderate MR and mild AI.  Since I saw her in the office a year ago she continues to do well.  She still works in the yard, [employer] and has mild dyspnea.  Her last 2D echo performed 09/04/2021 revealed EF of 40 to 45% with grade 1 diastolic dysfunction, mild AI and MR.  She denies chest pain.  She has had episodes of  dizziness and heart rates in the 30s on occasion on low-dose beta-blocker.  She also has frequent PVCs by event monitor performed 10/29/2021.   Current Meds  Medication Sig   carvedilol (COREG) 3.125 MG tablet Take 3.125 mg by mouth 2 (two) times daily.   Coenzyme Q10 (CO Q-10) 100 MG CAPS Take 100 mg by mouth daily.   furosemide (LASIX) 20 MG tablet Take 1 tablet (20 mg total) by mouth daily.   glipiZIDE (GLUCOTROL XL) 10 MG 24 hr tablet Take 10 mg by mouth daily.   glipiZIDE (GLUCOTROL XL) 5 MG 24 hr tablet Take 5 mg by mouth at bedtime.   Olmesartan-Amlodipine-HCTZ 40-5-12.5 MG TABS Take 1 tablet by mouth daily.   OneTouch Delica Lancets 26O MISC    ONETOUCH VERIO test strip AS DIRECTED 2 TIMES DAILY IN VITRO 90 DAYS   simvastatin (ZOCOR) 20 MG tablet Take 20 mg by mouth daily.   TRADJENTA 5 MG TABS tablet Take 5 mg by mouth daily.     Allergies  Allergen Reactions   Codeine Nausea Only   Prednisone Swelling    Social History   Socioeconomic History   Marital status: Married    Spouse name: Not on file   Number of children: Not on file   Years of education: Not on file   Highest education level: Not on file  Occupational History   Not on file  Tobacco Use   Smoking status: Never   Smokeless tobacco:  Never  Substance and Sexual Activity   Alcohol use: Not on file   Drug use: Not on file   Sexual activity: Not on file  Other Topics Concern   Not on file  Social History Narrative   Not on file   Social Determinants of Health   Financial Resource Strain: Not on file  Food Insecurity: Not on file  Transportation Needs: Not on file  Physical Activity: Not on file  Stress: Not on file  Social Connections: Not on file  Intimate Partner Violence: Not on file     Review of Systems: General: negative for chills, fever, night sweats or weight changes.  Cardiovascular: negative for chest pain, dyspnea on exertion, edema, orthopnea, palpitations, paroxysmal nocturnal  dyspnea or shortness of breath Dermatological: negative for rash Respiratory: negative for cough or wheezing Urologic: negative for hematuria Abdominal: negative for nausea, vomiting, diarrhea, bright red blood per rectum, melena, or hematemesis Neurologic: negative for visual changes, syncope, or dizziness All other systems reviewed and are otherwise negative except as noted above.    Blood pressure (!) 152/64, pulse 60, height '5\' 6"'$  (1.676 m), weight 161 lb (73 kg), SpO2 98 %.  General appearance: alert and no distress Neck: no adenopathy, no carotid bruit, no JVD, supple, symmetrical, trachea midline, and thyroid not enlarged, symmetric, no tenderness/mass/nodules Lungs: clear to auscultation bilaterally Heart: regular rate and rhythm, S1, S2 normal, no murmur, click, rub or gallop Extremities: extremities normal, atraumatic, no cyanosis or edema Pulses: 2+ and symmetric Skin: Skin color, texture, turgor normal. No rashes or lesions Neurologic: Grossly normal  EKG sinus rhythm at 60 with left milligrams block unchanged from prior EKGs.  I personally reviewed this EKG.  ASSESSMENT AND PLAN:   Essential hypertension History of essential hypertension blood pressure measured today at 152/64.  She is on carvedilol, olmesartan, amlodipine and hydrochlorothiazide.  Dyslipidemia History of hyperlipidemia on statin therapy with lipid profile performed 11/20/2021 revealing a total cholesterol of 146, LDL of 70 and HDL of 52.  Dyspnea on exertion Dyspnea on exertion with chest remained stable for years.  She does have mild to moderate LV dysfunction.  Mild aortic insufficiency Her most recent 2D echo performed 09/04/2021 revealed an EF of 40 to 45% with grade 1 diastolic dysfunction, mild MR and mild AI.  Slow heart rate Event monitor performed 10/24/2021 showed frequent PVCs with a 60% burden and occasional bradycardia.  She saw Caron Presume, PA-C back in June who noted occasional  heart rates in the 30s.  She is on low-dose beta-blocker for her PVCs.  At this point, no further evaluation will be pursued.     Lorretta Harp MD FACP,FACC,FAHA, [facility] 09/04/2022 9:19 AM

## 2022-09-04 NOTE — Assessment & Plan Note (Signed)
History of hyperlipidemia on statin therapy with lipid profile performed 11/20/2021 revealing a total cholesterol of 146, LDL of 70 and HDL of 52.

## 2022-09-04 NOTE — Assessment & Plan Note (Signed)
Her most recent 2D echo performed 09/04/2021 revealed an EF of 40 to 45% with grade 1 diastolic dysfunction, mild MR and mild AI.

## 2022-09-04 NOTE — Assessment & Plan Note (Signed)
History of essential hypertension blood pressure measured today at 152/64.  She is on carvedilol, olmesartan, amlodipine and hydrochlorothiazide.

## 2022-09-04 NOTE — Assessment & Plan Note (Signed)
Dyspnea on exertion with chest remained stable for years.  She does have mild to moderate LV dysfunction.

## 2022-09-04 NOTE — Patient Instructions (Signed)
Medication Instructions:  Your physician recommends that you continue on your current medications as directed. Please refer to the Current Medication list given to you today.  *If you need a refill on your cardiac medications before your next appointment, please call your pharmacy*   Follow-Up: At [facility], you and your health needs are our priority.  As part of our continuing mission to provide you with exceptional heart care, we have created designated Provider Care Teams.  These Care Teams include your primary Cardiologist (physician) and Advanced Practice Providers (APPs -  Physician Assistants and Nurse Practitioners) who all work together to provide you with the care you need, when you need it.  We recommend signing up for the patient portal called "MyChart".  Sign up information is provided on this After Visit Summary.  MyChart is used to connect with patients for Virtual Visits (Telemedicine).  Patients are able to view lab/test results, encounter notes, upcoming appointments, etc.  Non-urgent messages can be sent to your provider as well.   To learn more about what you can do with MyChart, go to [email or web address].    Your next appointment:   6 month(s)  The format for your next appointment:   In Person  Provider:   Jennifer, Lambert, PA-C       Then, Jonathan Berry, MD will plan to see you again in 12 month(s). 

## 2022-09-04 NOTE — Assessment & Plan Note (Signed)
Event monitor performed 10/24/2021 showed frequent PVCs with a 60% burden and occasional bradycardia.  She saw Caron Presume, PA-C back in June who noted occasional heart rates in the 30s.  She is on low-dose beta-blocker for her PVCs.  At this point, no further evaluation will be pursued.

## 2023-02-26 NOTE — Progress Notes (Unsigned)
Cardiology Office Note:    Date:  02/27/2023   ID:  Amanda Bartlett, DOB Dec 11, 1934, MRN [id number]  PCP:  Amanda Hansen, NP Amanda Bartlett Cardiologist: Amanda Batty, MD   Reason for visit: 6 month follow-up  History of Present Illness:    Amanda Bartlett is a 88 y.o. female with a hx of hypertension, hyperlipidemia, diabetes, family history of heart disease, aortic insufficiency, mitral regurgitation, lower extremity edema, and chronic systolic heart failure.  2D echo performed 09/04/2021 revealed a slight increase in her EF from 35 to 40% up to 40 to 45%.  She had mild MR and mild AI.  Patient reported low heart rates in 30s and 40s with associated weakness and dizziness.  Heart monitor ordered -showed normal sinus rhythm, lowest heart rate 50, average heart rate 69, frequent PVCs with PVC burden 16%.   I saw her in June 2023.  She mention when her heart rate gets in the 30s, her vision gets blurry.  She denied syncopal episodes.  She had stable dyspnea on exertion.  Case discussed with EP.  Patient with outflow tract PVCs.  We discussed options of continue current treatment or stopping Coreg and starting amiodarone 200 mg daily.  Patient opted to continue carvedilol.  She saw Dr. Allyson Bartlett in December 2023 and was doing well.  Today, she states she is doing well overall.  She feels like she has decreased energy as she gets older.  She states her blood pressure is labile at home.  She shows me a blood pressure log with average blood pressures 110s to 130s over 50s to 60s.  Occasional systolic blood pressures in the 140s.  Sometimes with systolic blood pressures in the high 90s, she will feel " fuzzy."  When this happens she rests for a few minutes and can keep going.  She denies syncope.  She denies palpitations.  She states her blood pressure cuff will tell her if she is having PVCs and she has noticed less PVC reads on her blood pressure cuff over the past couple months.  She denies significant  chest pain outside of a few twinges.  Her dyspnea on exertion is stable which she notices while doing yard work or going up an incline.  She denies leg swelling, PND and orthopnea.  She is taking Lasix 20 mg daily.  Bradycardia PVCs -30-day heart monitor 10/2021 -- ordered for reported HR in 30s and blurry vision --  showed normal sinus rhythm, lowest heart rate 50, average heart rate 69, frequent PVCs with PVC burden 17%.  Reviewed with EP - pt with outflow tract PVCs.  -Continue carvedilol 3.125 mg twice daily. -Difficult to treat PVCs with further AV nodal blocking agents given episodic bradycardia.  We previously discussed stopping Coreg and trialing amiodarone.  Since patient was relatively asymptomatic, we continue carvedilol.    Past Medical History:  Diagnosis Date   CHF (congestive heart failure) (HCC)    Diabetes mellitus without complication (HCC)    Hyperlipidemia    Hypertension     No past surgical history on file.  Current Medications: Current Meds  Medication Sig   carvedilol (COREG) 3.125 MG tablet Take 3.125 mg by mouth 2 (two) times daily.   Coenzyme Q10 (CO Q-10) 100 MG CAPS Take 100 mg by mouth daily.   furosemide (LASIX) 20 MG tablet Take 1 tablet (20 mg total) by mouth daily. (Patient taking differently: Take 20 mg by mouth daily. Take 1/2 Tablet Daily. Can Take whole  Tablet for swelling and edema.)   glipiZIDE (GLUCOTROL XL) 10 MG 24 hr tablet Take 10 mg by mouth daily.   glipiZIDE (GLUCOTROL XL) 5 MG 24 hr tablet Take 5 mg by mouth at bedtime.   Olmesartan-Amlodipine-HCTZ 40-5-12.5 MG TABS Take 1 tablet by mouth daily.   OneTouch Delica Lancets 33G MISC    ONETOUCH VERIO test strip AS DIRECTED 2 TIMES DAILY IN VITRO 90 DAYS   simvastatin (ZOCOR) 20 MG tablet Take 20 mg by mouth daily.   TRADJENTA 5 MG TABS tablet Take 5 mg by mouth daily.     Allergies:   Codeine and Prednisone   Social History   Socioeconomic History   Marital status: Married    Spouse  name: Not on file   Number of children: Not on file   Years of education: Not on file   Highest education level: Not on file  Occupational History   Not on file  Tobacco Use   Smoking status: Never   Smokeless tobacco: Never  Substance and Sexual Activity   Alcohol use: Not on file   Drug use: Not on file   Sexual activity: Not on file  Other Topics Concern   Not on file  Social History Narrative   Not on file   Social Determinants of Health   Financial Resource Strain: Not on file  Food Insecurity: Not on file  Transportation Needs: Not on file  Physical Activity: Not on file  Stress: Not on file  Social Connections: Not on file     Family History: The patient's family history is not on file.  ROS:   Please see the history of present illness.     EKGs/Labs/Other Studies Reviewed:    EKG:  The ekg ordered today demonstrates sinus bradycardia with heart rate 59, left bundle branch block.  Recent Labs: No results found for requested labs within last 365 days.   Recent Lipid Panel No results found for: "CHOL", "TRIG", "HDL", "LDLCALC", "LDLDIRECT"  Physical Exam:    VS:  BP (!) 142/92   Pulse (!) 59   Ht 5\' 4"  (1.626 m)   Wt 161 lb 6.4 oz (73.2 kg)   SpO2 95%   BMI 27.70 kg/m    No data found.   Wt Readings from Last 3 Encounters:  02/27/23 161 lb 6.4 oz (73.2 kg)  09/04/22 161 lb (73 kg)  03/01/22 160 lb 12.8 oz (72.9 kg)     GEN:  Well nourished, well developed in no acute distress HEENT: Normal NECK: No JVD; No carotid bruits CARDIAC: RRR with ectopics, no murmurs, rubs, gallops RESPIRATORY:  Clear to auscultation without rales, wheezing or rhonchi  ABDOMEN: Soft, non-tender, non-distended MUSCULOSKELETAL: No edema SKIN: Warm and dry NEUROLOGIC:  Alert and oriented PSYCHIATRIC:  Normal affect    ASSESSMENT AND PLAN   Chronic systolic and diastolic heart failure, euvolemic -Echo December 2022 with a EF of 40 to 45%, grade 1 diastolic  dysfunction -With occasional " fuzziness"/borderline blood pressure and with summer heat, recommend she try to cut back her Lasix to half tablet daily (20mg  1/2 tablet daily).  She can return to full tablet if she notices leg swelling.  Aortic insufficiency Mitral regurgitation -2D echo December 2022 with mild AI, mild MR -No significant murmur heard on exam.  Hypertension, well controlled -Overall controlled per home BP log. -Continue olmesartan/amlodipine/HCTZ 40-5-12.5 mg daily and carvedilol 3.125 mg twice daily.   -Allow permissive hypertension given her age.   Hyperlipidemia -  LDL 60 in February 2024.  Continue Zocor 20 mg daily.   Disposition - Follow-up in 6 months with Dr. Allyson Bartlett.   Medication Adjustments/Labs and Tests Ordered: Current medicines are reviewed at length with the patient today.  Concerns regarding medicines are outlined above.  Orders Placed This Encounter  Procedures   EKG 12-Lead   No orders of the defined types were placed in this encounter.   Patient Instructions  Medication Instructions:  Decrease Lasix to 10 mg ( Take 1/2 of 20 mg Tablet Daily. For Edema and Swelling Can Take Whole Tablet ). *If you need a refill on your cardiac medications before your next appointment, please call your pharmacy*   Lab Work: No labs If you have labs (blood work) drawn today and your tests are completely normal, you will receive your results only by: MyChart Message (if you have MyChart) OR A paper copy in the mail If you have any lab test that is abnormal or we need to change your treatment, we will call you to review the results.   Testing/Procedures: No Testing   Follow-Up: At [facility], you and your health needs are our priority.  As part of our continuing mission to provide you with exceptional heart care, we have created designated Provider Care Teams.  These Care Teams include your primary Cardiologist (physician) and Advanced Practice  Providers (APPs -  Physician Assistants and Nurse Practitioners) who all work together to provide you with the care you need, when you need it.  We recommend signing up for the patient portal called "MyChart".  Sign up information is provided on this After Visit Summary.  MyChart is used to connect with patients for Virtual Visits (Telemedicine).  Patients are able to view lab/test results, encounter notes, upcoming appointments, etc.  Non-urgent messages can be sent to your provider as well.   To learn more about what you can do with MyChart, go to [email or web address].    Your next appointment:   6 month(s)  Provider:   Nanetta Batty, MD     Signed, Cannon Kettle, PA-C  02/27/2023 9:40 AM    [facility]

## 2023-02-27 ENCOUNTER — Ambulatory Visit: Payer: Medicare HMO | Attending: Physician Assistant | Admitting: Physician Assistant

## 2023-02-27 ENCOUNTER — Encounter: Payer: Self-pay | Admitting: Physician Assistant

## 2023-02-27 VITALS — BP 142/92 | HR 59 | Ht 64.0 in | Wt 161.4 lb

## 2023-02-27 DIAGNOSIS — I5042 Chronic combined systolic (congestive) and diastolic (congestive) heart failure: Secondary | ICD-10-CM

## 2023-02-27 DIAGNOSIS — R001 Bradycardia, unspecified: Secondary | ICD-10-CM

## 2023-02-27 DIAGNOSIS — I493 Ventricular premature depolarization: Secondary | ICD-10-CM | POA: Diagnosis not present

## 2023-02-27 DIAGNOSIS — E785 Hyperlipidemia, unspecified: Secondary | ICD-10-CM

## 2023-02-27 DIAGNOSIS — I10 Essential (primary) hypertension: Secondary | ICD-10-CM | POA: Diagnosis not present

## 2023-02-27 NOTE — Patient Instructions (Signed)
Medication Instructions:  Decrease Lasix to 10 mg ( Take 1/2 of 20 mg Tablet Daily. For Edema and Swelling Can Take Whole Tablet ). *If you need a refill on your cardiac medications before your next appointment, please call your pharmacy*   Lab Work: No labs If you have labs (blood work) drawn today and your tests are completely normal, you will receive your results only by: MyChart Message (if you have MyChart) OR A paper copy in the mail If you have any lab test that is abnormal or we need to change your treatment, we will call you to review the results.   Testing/Procedures: No Testing   Follow-Up: At [facility], you and your health needs are our priority.  As part of our continuing mission to provide you with exceptional heart care, we have created designated Provider Care Teams.  These Care Teams include your primary Cardiologist (physician) and Advanced Practice Providers (APPs -  Physician Assistants and Nurse Practitioners) who all work together to provide you with the care you need, when you need it.  We recommend signing up for the patient portal called "MyChart".  Sign up information is provided on this After Visit Summary.  MyChart is used to connect with patients for Virtual Visits (Telemedicine).  Patients are able to view lab/test results, encounter notes, upcoming appointments, etc.  Non-urgent messages can be sent to your provider as well.   To learn more about what you can do with MyChart, go to [email or web address].    Your next appointment:   6 month(s)  Provider:   Nanetta Batty, MD

## 2023-09-09 ENCOUNTER — Ambulatory Visit: Payer: Medicare HMO | Attending: Cardiovascular Disease | Admitting: Cardiovascular Disease

## 2023-09-09 ENCOUNTER — Encounter: Payer: Self-pay | Admitting: Cardiovascular Disease

## 2023-09-09 VITALS — BP 120/70 | HR 73 | Ht 64.0 in | Wt 157.4 lb

## 2023-09-09 DIAGNOSIS — E785 Hyperlipidemia, unspecified: Secondary | ICD-10-CM

## 2023-09-09 DIAGNOSIS — I351 Nonrheumatic aortic (valve) insufficiency: Secondary | ICD-10-CM

## 2023-09-09 DIAGNOSIS — I493 Ventricular premature depolarization: Secondary | ICD-10-CM | POA: Diagnosis not present

## 2023-09-09 DIAGNOSIS — R001 Bradycardia, unspecified: Secondary | ICD-10-CM

## 2023-09-09 DIAGNOSIS — I10 Essential (primary) hypertension: Secondary | ICD-10-CM | POA: Diagnosis not present

## 2023-09-09 NOTE — Assessment & Plan Note (Signed)
History of PVCs with a 16% burden by event monitoring.  She is on a low-dose beta-blocker.

## 2023-09-09 NOTE — Progress Notes (Signed)
09/09/2023 [name]   [birth year]  [id number]  Primary Physician Mitzi Hansen, NP Primary Cardiologist: Runell Gess MD Nicholes Calamity, [place]  HPI:  Amanda Bartlett is a 88 y.o.   mildly overweight married Caucasian female mother of 4, grandmother of 11 grandchildren who worked at [employer] and [employer] during her working years. She was referred by Lovenia Kim PA-C for cardiovascular evaluation because of fairly new onset dyspnea on exertion and auscultated outflow tract murmur. I last  saw her in the office 09/04/2022.  Her husband Mariana Amanda Bartlett is also a patient of mine.  Her risk factors include treated hypertension, diabetes and hyperlipidemia. There is a family history of heart disease. She has never had a heart attack or stroke. She's noticed increasing dyspnea on exertion over the last year with with occasional atypical chest pain. Her primary care provider did auscultate a murmur. I obtained a 2-D echocardiogram on 07/09/16 revealing normal LV size and function with mild to moderate aortic insufficiency. A Myoview stress test was normal as well. She was complaining of some edema which resolved with reduction of her amlodipine dose. She does keep a careful log of her blood pressures several times a day and is concerned about the fluctuation in her blood pressure and heart rate.    She does have mild chronic dyspnea and mild lower extremity edema.  She has occasional atypical chest pain although she has had a negative Myoview stress test 07/31/2016.  2D echo performed 02/23/2021 revealed a decline in her EF from normal back on 12/01/2018 down to 35 to 40% recently with mild to moderate MR and mild AI.  Since I saw her in the office a year ago she continues to do well.  She still works in the yard, [employer] and has mild dyspnea.  Her last 2D echo performed 09/04/2021 revealed EF of 40 to 45% with grade 1 diastolic dysfunction, mild AI and MR.  She denies chest pain.  She has had episodes of  dizziness and heart rates in the 30s on occasion on low-dose beta-blocker.  She also has frequent PVCs by event monitor performed 10/29/2021 with a 16% burden.    Current Meds  Medication Sig   carvedilol (COREG) 3.125 MG tablet Take 3.125 mg by mouth 2 (two) times daily.   Coenzyme Q10 (CO Q-10) 100 MG CAPS Take 100 mg by mouth daily.   furosemide (LASIX) 20 MG tablet Take 1 tablet (20 mg total) by mouth daily. (Patient taking differently: Take 20 mg by mouth daily. Take 1/2 Tablet Daily. Can Take whole Tablet for swelling and edema.)   glipiZIDE (GLUCOTROL XL) 10 MG 24 hr tablet Take 10 mg by mouth daily.   glipiZIDE (GLUCOTROL XL) 5 MG 24 hr tablet Take 5 mg by mouth at bedtime.   Olmesartan-Amlodipine-HCTZ 40-5-12.5 MG TABS Take 1 tablet by mouth daily.   simvastatin (ZOCOR) 20 MG tablet Take 20 mg by mouth daily.   TRADJENTA 5 MG TABS tablet Take 5 mg by mouth daily.     Allergies  Allergen Reactions   Codeine Nausea Only   Prednisone Swelling    Social History   Socioeconomic History   Marital status: Married    Spouse name: Not on file   Number of children: Not on file   Years of education: Not on file   Highest education level: Not on file  Occupational History   Not on file  Tobacco Use   Smoking status: Never  Smokeless tobacco: Never  Substance and Sexual Activity   Alcohol use: Not on file   Drug use: Not on file   Sexual activity: Not on file  Other Topics Concern   Not on file  Social History Narrative   Not on file   Social Determinants of Health   Financial Resource Strain: Not on file  Food Insecurity: Not on file  Transportation Needs: Not on file  Physical Activity: Not on file  Stress: Not on file  Social Connections: Not on file  Intimate Partner Violence: Not on file     Review of Systems: General: negative for chills, fever, night sweats or weight changes.  Cardiovascular: negative for chest pain, dyspnea on exertion, edema, orthopnea,  palpitations, paroxysmal nocturnal dyspnea or shortness of breath Dermatological: negative for rash Respiratory: negative for cough or wheezing Urologic: negative for hematuria Abdominal: negative for nausea, vomiting, diarrhea, bright red blood per rectum, melena, or hematemesis Neurologic: negative for visual changes, syncope, or dizziness All other systems reviewed and are otherwise negative except as noted above.    Blood pressure 120/70, pulse 73, height 5\' 4"  (1.626 m), weight 157 lb 6.4 oz (71.4 kg), SpO2 98%.  General appearance: alert and no distress Neck: no adenopathy, no carotid bruit, no JVD, supple, symmetrical, trachea midline, and thyroid not enlarged, symmetric, no tenderness/mass/nodules Lungs: clear to auscultation bilaterally Heart: regular rate and rhythm, S1, S2 normal, no murmur, click, rub or gallop Extremities: extremities normal, atraumatic, no cyanosis or edema Pulses: 2+ and symmetric Skin: Skin color, texture, turgor normal. No rashes or lesions Neurologic: Grossly normal  EKG EKG Interpretation Date/Time:  Monday September 09 2023 08:56:31 EST Ventricular Rate:  65 PR Interval:  192 QRS Duration:  158 QT Interval:  462 QTC Calculation: 480 R Axis:   87  Text Interpretation: Normal sinus rhythm Left bundle branch block No previous ECGs available Confirmed by Nanetta Batty [phone]) on 09/09/2023 9:14:13 AM    ASSESSMENT AND PLAN:   Essential hypertension History of essential hypertension her blood pressure measured today at 120/70.  She is on olmesartan, amlodipine, hydrochlorothiazide and carvedilol.  She says her blood pressure somewhat labile going up to 160 on rare occasions and down to 90 systolic.  Dyslipidemia History of dyslipidemia on statin therapy with lipid profile performed 06/12/2023 revealing total cholesterol 142, LDL 63 and HDL 49.  PVC's (premature ventricular contractions) History of PVCs with a 16% burden by event monitoring.  She  is on a low-dose beta-blocker.  Mild aortic insufficiency Mild aortic insufficiency by 2D echo last performed 09/04/2021.  She had mild to moderate LV dysfunction with an EF of 40 to 45%.  Slow heart rate Intermittent bradycardia with heart rates down to the 30s probably related to PVCs.     Runell Gess MD FACP,FACC,FAHA, [facility] 09/09/2023 9:25 AM

## 2023-09-09 NOTE — Assessment & Plan Note (Signed)
Mild aortic insufficiency by 2D echo last performed 09/04/2021.  She had mild to moderate LV dysfunction with an EF of 40 to 45%.

## 2023-09-09 NOTE — Assessment & Plan Note (Signed)
History of essential hypertension her blood pressure measured today at 120/70.  She is on olmesartan, amlodipine, hydrochlorothiazide and carvedilol.  She says her blood pressure somewhat labile going up to 160 on rare occasions and down to 90 systolic.

## 2023-09-09 NOTE — Patient Instructions (Signed)
Medication Instructions:  Your physician recommends that you continue on your current medications as directed. Please refer to the Current Medication list given to you today.  *If you need a refill on your cardiac medications before your next appointment, please call your pharmacy*   Follow-Up: At [facility], you and your health needs are our priority.  As part of our continuing mission to provide you with exceptional heart care, we have created designated Provider Care Teams.  These Care Teams include your primary Cardiologist (physician) and Advanced Practice Providers (APPs -  Physician Assistants and Nurse Practitioners) who all work together to provide you with the care you need, when you need it.  We recommend signing up for the patient portal called "MyChart".  Sign up information is provided on this After Visit Summary.  MyChart is used to connect with patients for Virtual Visits (Telemedicine).  Patients are able to view lab/test results, encounter notes, upcoming appointments, etc.  Non-urgent messages can be sent to your provider as well.   To learn more about what you can do with MyChart, go to [email or web address].    Your next appointment:   6 month(s)  Provider:   Juanda Crumble, PA-C      Then, Nanetta Batty, MD will plan to see you again in 12 month(s).

## 2023-09-09 NOTE — Assessment & Plan Note (Signed)
History of dyslipidemia on statin therapy with lipid profile performed 06/12/2023 revealing total cholesterol 142, LDL 63 and HDL 49.

## 2023-09-09 NOTE — Assessment & Plan Note (Signed)
Intermittent bradycardia with heart rates down to the 30s probably related to PVCs.

## 2024-03-10 ENCOUNTER — Ambulatory Visit: Attending: Cardiovascular Disease | Admitting: Cardiovascular Disease

## 2024-03-10 ENCOUNTER — Encounter: Payer: Self-pay | Admitting: Cardiovascular Disease

## 2024-03-10 VITALS — BP 144/60 | HR 64 | Ht 65.0 in | Wt 159.0 lb

## 2024-03-10 DIAGNOSIS — I493 Ventricular premature depolarization: Secondary | ICD-10-CM

## 2024-03-10 DIAGNOSIS — I10 Essential (primary) hypertension: Secondary | ICD-10-CM | POA: Diagnosis not present

## 2024-03-10 DIAGNOSIS — I447 Left bundle-branch block, unspecified: Secondary | ICD-10-CM

## 2024-03-10 DIAGNOSIS — E785 Hyperlipidemia, unspecified: Secondary | ICD-10-CM

## 2024-03-10 DIAGNOSIS — I519 Heart disease, unspecified: Secondary | ICD-10-CM

## 2024-03-10 NOTE — Assessment & Plan Note (Signed)
 History of mild LV dysfunction with an EF of 40 to 45% by 2D echo 09/04/2021 with global hypokinesia.  She does have mild aortic insufficiency and grade 1 diastolic dysfunction.  She has mild dyspnea when walking up an incline but has remained otherwise asymptomatic.  She is on GDMT.

## 2024-03-10 NOTE — Assessment & Plan Note (Signed)
 History of dyslipidemia on statin therapy with lipid profile performed 06/12/2023 revealing total cholesterol 42, LDL 63 and HDL 49.

## 2024-03-10 NOTE — Progress Notes (Signed)
 03/10/2024 Amanda Bartlett   Oct 30, 1934  [id number]  Primary Physician Masneri, Amanda Pons, DO (Inactive) Primary Cardiologist: Amanda Leigh MD Amanda Bartlett, Amanda Bartlett  HPI:  Amanda Bartlett is a 89 y.o.  mildly overweight married Caucasian female mother of 4, grandmother of 11 grandchildren who worked at [employer] and [employer] during her working years. She was referred by Amanda Brazen PA-C for cardiovascular evaluation because of fairly new onset dyspnea on exertion and auscultated outflow tract murmur. I last  saw her in the office 09/09/2023.  Her husband Amanda Bartlett is also a patient of mine.  Her risk factors include treated hypertension, diabetes and hyperlipidemia. There is a family history of heart disease. She has never had a heart attack or stroke. She's noticed increasing dyspnea on exertion over the last year with with occasional atypical chest pain. Her primary care provider did auscultate a murmur. I obtained a 2-D echocardiogram on 07/09/16 revealing normal LV size and function with mild to moderate aortic insufficiency. A Myoview  stress test was normal as well. She was complaining of some edema which resolved with reduction of her amlodipine dose. She does keep a careful log of her blood pressures several times a day and is concerned about the fluctuation in her blood pressure and heart rate.    She does have mild chronic dyspnea and mild lower extremity edema.  She has occasional atypical chest pain although she has had a negative Myoview  stress test 07/31/2016.  2D echo performed 02/23/2021 revealed a decline in her EF from normal back on 12/01/2018 down to 35 to 40% recently with mild to moderate MR and mild AI.  Her last 2D echo performed 09/04/2021 revealed EF of 40 to 45% with grade 1 diastolic dysfunction, mild AI and MR..  She has had episodes of dizziness and heart rates in the 30s on occasion on low-dose beta-blocker.  She also has frequent PVCs by event monitor performed 10/29/2021 with a 16%  burden.  Since I saw her 6 months ago she says that her palpitations have clinically improved.  She was fairly active raking leaves in the fall.  She has only mild dyspnea walking up an incline but denies chest pain.   Current Meds  Medication Sig   carvedilol  (COREG ) 3.125 MG tablet Take 3.125 mg by mouth 2 (two) times daily.   Coenzyme Q10 (CO Q-10) 100 MG CAPS Take 100 mg by mouth daily.   furosemide  (LASIX ) 20 MG tablet Take 1 tablet (20 mg total) by mouth daily. (Patient taking differently: Take 20 mg by mouth daily. Take 1/2 Tablet Daily. Can Take whole Tablet for swelling and edema.)   glipiZIDE (GLUCOTROL XL) 10 MG 24 hr tablet Take 10 mg by mouth daily.   glipiZIDE (GLUCOTROL XL) 5 MG 24 hr tablet Take 5 mg by mouth at bedtime.   Olmesartan-Amlodipine-HCTZ 40-5-12.5 MG TABS Take 1 tablet by mouth daily.   OneTouch Delica Lancets 33G MISC    ONETOUCH VERIO test strip AS DIRECTED 2 TIMES DAILY IN VITRO 90 DAYS   simvastatin (ZOCOR) 20 MG tablet Take 20 mg by mouth daily.   TRADJENTA 5 MG TABS tablet Take 5 mg by mouth daily.     Allergies  Allergen Reactions   Codeine Nausea Only   Prednisone Swelling    Social History   Socioeconomic History   Marital status: Married    Spouse name: Not on file   Number of children: Not on file   Years  of education: Not on file   Highest education level: Not on file  Occupational History   Not on file  Tobacco Use   Smoking status: Never   Smokeless tobacco: Never  Substance and Sexual Activity   Alcohol use: Not on file   Drug use: Not on file   Sexual activity: Not on file  Other Topics Concern   Not on file  Social History Narrative   Not on file   Social Drivers of Health   Financial Resource Strain: Not on file  Food Insecurity: Not on file  Transportation Needs: Not on file  Physical Activity: Not on file  Stress: Not on file  Social Connections: Not on file  Intimate Partner Violence: Not on file     Review of  Systems: General: negative for chills, fever, night sweats or weight changes.  Cardiovascular: negative for chest pain, dyspnea on exertion, edema, orthopnea, palpitations, paroxysmal nocturnal dyspnea or shortness of breath Dermatological: negative for rash Respiratory: negative for cough or wheezing Urologic: negative for hematuria Abdominal: negative for nausea, vomiting, diarrhea, bright red blood per rectum, melena, or hematemesis Neurologic: negative for visual changes, syncope, or dizziness All other systems reviewed and are otherwise negative except as noted above.    Blood pressure (!) 144/60, pulse 64, height 5\' 5"  (1.651 m), weight 159 lb (72.1 kg), SpO2 97%.  General appearance: alert and no distress Neck: no adenopathy, no carotid bruit, no JVD, supple, symmetrical, trachea midline, and thyroid  not enlarged, symmetric, no tenderness/mass/nodules Lungs: clear to auscultation bilaterally Heart: regular rate and rhythm, S1, S2 normal, no murmur, click, rub or gallop Extremities: extremities normal, atraumatic, no cyanosis or edema Pulses: 2+ and symmetric Skin: Skin color, texture, turgor normal. No rashes or lesions Neurologic: Grossly normal  EKG EKG Interpretation Date/Time:  Tuesday March 10 2024 09:12:07 EDT Ventricular Rate:  64 PR Interval:  196 QRS Duration:  156 QT Interval:  446 QTC Calculation: 460 R Axis:   -15  Text Interpretation: Normal sinus rhythm with sinus arrhythmia Left bundle branch block When compared with ECG of 09-Sep-2023 08:56, Questionable change in QRS axis T wave inversion no longer evident in Inferior leads T wave inversion more evident in Lateral leads Confirmed by Lauro Portal [phone]) on 03/10/2024 9:22:32 AM    ASSESSMENT AND PLAN:   Essential hypertension History of essential hypertension blood pressure measured today 144/60.  She is on low-dose carvedilol , olmesartan, amlodipine and hydrochlorothiazide.  Dyslipidemia History of  dyslipidemia on statin therapy with lipid profile performed 06/12/2023 revealing total cholesterol 42, LDL 63 and HDL 49.  PVC's (premature ventricular contractions) History of PVCs on event monitor performed 10/29/2021 with a 16% burden.  She has normal low-dose beta-blocker.  She said since I saw her 6 months ago he is not clinically improved.  LV dysfunction History of mild LV dysfunction with an EF of 40 to 45% by 2D echo 09/04/2021 with global hypokinesia.  She does have mild aortic insufficiency and grade 1 diastolic dysfunction.  She has mild dyspnea when walking up an incline but has remained otherwise asymptomatic.  She is on GDMT.  Left bundle branch block Chronic     Amanda Leigh MD [facility], [facility] 03/10/2024 9:32 AM

## 2024-03-10 NOTE — Patient Instructions (Signed)
 Medication Instructions:  No changes *If you need a refill on your cardiac medications before your next appointment, please call your pharmacy*  Follow-Up: At [facility], you and your health needs are our priority.  As part of our continuing mission to provide you with exceptional heart care, our providers are all part of one team.  This team includes your primary Cardiologist (physician) and Advanced Practice Providers or APPs (Physician Assistants and Nurse Practitioners) who all work together to provide you with the care you need, when you need it.  Your next appointment:   1 year(s)  Provider:   Lauro Portal, MD    We recommend signing up for the patient portal called "MyChart".  Sign up information is provided on this After Visit Summary.  MyChart is used to connect with patients for Virtual Visits (Telemedicine).  Patients are able to view lab/test results, encounter notes, upcoming appointments, etc.  Non-urgent messages can be sent to your provider as well.   To learn more about what you can do with MyChart, go to [email or web address].

## 2024-03-10 NOTE — Assessment & Plan Note (Signed)
 History of essential hypertension blood pressure measured today 144/60.  She is on low-dose carvedilol , olmesartan, amlodipine and hydrochlorothiazide.

## 2024-03-10 NOTE — Assessment & Plan Note (Signed)
 Chronic

## 2024-03-10 NOTE — Assessment & Plan Note (Signed)
 History of PVCs on event monitor performed 10/29/2021 with a 16% burden.  She has normal low-dose beta-blocker.  She said since I saw her 6 months ago he is not clinically improved.

## 2024-04-17 ENCOUNTER — Encounter: Payer: Self-pay | Admitting: Advanced Practice Midwife
